# Patient Record
Sex: Male | Born: 1965 | Race: Black or African American | Hispanic: No | Marital: Single | Smoking: Current some day smoker
Health system: Southern US, Community
[De-identification: ages and names within clinical notes are randomized; demographics above are authoritative.]

## PROBLEM LIST (undated history)

## (undated) DIAGNOSIS — I10 Essential (primary) hypertension: Secondary | ICD-10-CM

## (undated) DIAGNOSIS — I509 Heart failure, unspecified: Secondary | ICD-10-CM

## (undated) DIAGNOSIS — N189 Chronic kidney disease, unspecified: Secondary | ICD-10-CM

## (undated) HISTORY — DX: Essential (primary) hypertension: I10

## (undated) HISTORY — DX: Heart failure, unspecified: I50.9

## (undated) HISTORY — PX: CARDIAC CATHETERIZATION: SHX172

## (undated) HISTORY — DX: Chronic kidney disease, unspecified: N18.9

---

## 1999-11-13 ENCOUNTER — Emergency Department (HOSPITAL_COMMUNITY): Admission: EM | Admit: 1999-11-13 | Discharge: 1999-11-13 | Payer: Self-pay | Admitting: Emergency Medicine

## 1999-11-13 ENCOUNTER — Encounter: Payer: Self-pay | Admitting: Emergency Medicine

## 2009-11-06 ENCOUNTER — Emergency Department: Payer: Self-pay | Admitting: Emergency Medicine

## 2011-01-17 ENCOUNTER — Ambulatory Visit: Payer: Self-pay | Admitting: Internal Medicine

## 2011-01-19 ENCOUNTER — Inpatient Hospital Stay: Payer: Self-pay | Admitting: Student

## 2011-01-19 DIAGNOSIS — R0602 Shortness of breath: Secondary | ICD-10-CM

## 2011-01-20 DIAGNOSIS — I10 Essential (primary) hypertension: Secondary | ICD-10-CM

## 2011-02-03 ENCOUNTER — Ambulatory Visit (INDEPENDENT_AMBULATORY_CARE_PROVIDER_SITE_OTHER): Payer: Managed Care, Other (non HMO) | Admitting: Cardiovascular Disease

## 2011-02-03 ENCOUNTER — Encounter: Payer: Self-pay | Admitting: Cardiovascular Disease

## 2011-02-03 DIAGNOSIS — I119 Hypertensive heart disease without heart failure: Secondary | ICD-10-CM

## 2011-02-03 DIAGNOSIS — I428 Other cardiomyopathies: Secondary | ICD-10-CM

## 2011-02-03 DIAGNOSIS — R609 Edema, unspecified: Secondary | ICD-10-CM

## 2011-02-03 DIAGNOSIS — R0602 Shortness of breath: Secondary | ICD-10-CM

## 2011-02-03 DIAGNOSIS — E785 Hyperlipidemia, unspecified: Secondary | ICD-10-CM

## 2011-02-03 DIAGNOSIS — I10 Essential (primary) hypertension: Secondary | ICD-10-CM | POA: Insufficient documentation

## 2011-02-03 MED ORDER — FUROSEMIDE 20 MG PO TABS: 20.0000 mg | ORAL_TABLET | Freq: Every day | ORAL | Status: DC | PRN

## 2011-02-03 MED ORDER — HYDRALAZINE HCL 100 MG PO TABS: 100.0000 mg | ORAL_TABLET | Freq: Four times a day (QID) | ORAL | Status: DC

## 2011-02-03 NOTE — Assessment & Plan Note (Signed)
Etiology of his depressed ejection fraction likely secondary to severe hypertensive cardiomyopathy. We will continue aggressive medical management of his blood pressure. He does have lower extremity edema and will start Lasix 20 mg p.r.n. For symptoms of edema or shortness of breath. We have instructed him to take this with banana or citrus or over-the-counter potassium.

## 2011-02-03 NOTE — Assessment & Plan Note (Signed)
We will start Lasix for his edema. This will also help his blood pressure. We have encouraged him to stay away from salt.

## 2011-02-03 NOTE — Assessment & Plan Note (Signed)
We will increase his hydralazine to 75 mg q.i.d. With possible titration to 100 mg q.i.d. To reach a diastolic pressure less than 90.

## 2011-02-03 NOTE — Assessment & Plan Note (Signed)
He did have one episode of shortness of breath after discharge. Uncertain if this is from hypertension or mild fluid overload. We will start the Lasix and have him closely monitor his blood pressure.

## 2011-02-03 NOTE — Progress Notes (Signed)
Patient ID: Cristian Pollard, male    DOB: 11/01/1955, 45 y.o.   MRN: [id number]  HPI Comments: 45 year old male with history of severe hypertension, presenting to the hospital on September 3 with shortness of breath, found to have renal failure, elevated cardiac enzymes, severe hypertension. Echocardiogram showed ejection fraction 35-45%, previous cardiac catheterization several years with reportedly no disease, hyperlipidemia with total cholesterol 250, LDL 156, BNP 1300, peak troponin of 0.15 felt secondary to hypertension, LVH.  He presents to establish care in the office.  He was discharged on labetalol, hydralazine q.i.d., nifedipine 90 mg daily, aspirin and simvastatin.  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 Since then, he has not had any problems. His blood pressure continues to be borderline elevated though significantly better and prior to his admission. He is scheduled to have followup with Dr. Cherylann Ratel in several days.  EKG shows normal sinus rhythm with rate 88 beats per minute, T-wave abnormality in V3 through V6, 2, 3, aVF   Outpatient Encounter Prescriptions as of 02/03/2011  Medication Sig Dispense Refill  . aspirin 81 MG tablet Take 81 mg by mouth daily.        Marland Kitchen labetalol (NORMODYNE) 300 MG tablet Take 300 mg by mouth 2 (two) times daily.        Marland Kitchen NIFEdipine (PROCARDIA XL/ADALAT-CC) 90 MG 24 hr tablet Take 90 mg by mouth daily.        . simvastatin (ZOCOR) 20 MG tablet Take 20 mg by mouth at bedtime.        .  hydrALAZINE (APRESOLINE) 50 MG tablet Take 50 mg by mouth 4 (four) times daily.           Review of Systems  Constitutional: Negative.   HENT: Negative.   Eyes: Negative.   Respiratory: Positive for shortness of breath.   Cardiovascular: Negative.   Gastrointestinal: Negative.   Musculoskeletal: Negative.   Skin: Negative.   Neurological: Negative.   Hematological:  Negative.   Psychiatric/Behavioral: Negative.   All other systems reviewed and are negative.    BP 165/97  Pulse 90  Ht 5\' 8"  (1.727 m)  Wt 243 lb (110.224 kg)  BMI 36.95 kg/m2   Physical Exam  Nursing note and vitals reviewed. Constitutional: He is oriented to person, place, and time. He appears well-developed and well-nourished.  HENT:  Head: Normocephalic.  Nose: Nose normal.  Mouth/Throat: Oropharynx is clear and moist.  Eyes: Conjunctivae are normal. Pupils are equal, round, and reactive to light.  Neck: Normal range of motion. Neck supple. No JVD present.  Cardiovascular: Normal rate, regular rhythm, S1 normal, S2 normal, normal heart sounds and intact distal pulses.  Exam reveals no gallop and no friction rub.   No murmur heard.      Trace to 1+ edema to the mid shins that is pitting  Pulmonary/Chest: Effort normal and breath sounds normal. No respiratory distress. He has no wheezes. He has no rales. He exhibits no tenderness.  Abdominal: Soft. Bowel sounds are normal. He exhibits no distension. There is no tenderness.  Musculoskeletal: Normal range of motion. He exhibits no edema and no tenderness.  Lymphadenopathy:    He has no cervical adenopathy.  Neurological: He is alert and oriented to person, place, and time. Coordination normal.  Skin: Skin is warm and dry. No rash noted. No erythema.  Psychiatric: He has a normal mood and  affect. His behavior is normal. Judgment and thought content normal.           Assessment and Plan

## 2011-02-03 NOTE — Assessment & Plan Note (Signed)
Cholesterol is very elevated, measured at 250 in the hospital. We have suggested a cholesterol check in 3 months time.

## 2011-02-03 NOTE — Patient Instructions (Signed)
Your physician has recommended you make the following change in your medication: INCREASE Hydralazine to 1 1/2 tablet (75mg ) for 1 week, if BP still elevated, then INCREASE to 100mg  4 times a day.  Start Lasix 20mg  in AM AS NEEDED for leg swelling or shortness of breath.   Your physician recommends that you schedule a follow-up appointment in: 3 months

## 2011-03-13 ENCOUNTER — Telehealth: Payer: Self-pay | Admitting: *Deleted

## 2011-03-16 ENCOUNTER — Ambulatory Visit (INDEPENDENT_AMBULATORY_CARE_PROVIDER_SITE_OTHER): Payer: Managed Care, Other (non HMO) | Admitting: *Deleted

## 2011-03-16 ENCOUNTER — Encounter: Payer: Self-pay | Admitting: *Deleted

## 2011-03-16 ENCOUNTER — Other Ambulatory Visit: Payer: Self-pay | Admitting: Cardiovascular Disease

## 2011-03-16 VITALS — BP 110/80 | HR 89 | Ht 68.0 in | Wt 237.0 lb

## 2011-03-16 DIAGNOSIS — R609 Edema, unspecified: Secondary | ICD-10-CM

## 2011-03-16 NOTE — Progress Notes (Signed)
Pt in today for BMP/BNP due to continued BLE edema. Pt was seen 02/03/11, and was started on Lasix 20mg  PRN. Pt states he has been taking this daily. Pt is on feet all day at work, BLE showing 1+ pitting edema today. Pt denies SOB. His weight is down 6 lbs, today 237 lbs in >1 month. I advised pt to take an extra dose of Lasix 20mg  today only and will discuss with Dr. Mariah Milling for further rec's. Will call pt with lab results tomorrow and will call him back today with Dr. Ethelene Hal recommendation. Pt ok with this.

## 2011-03-17 LAB — BASIC METABOLIC PANEL
Calcium: 9.1 mg/dL (ref 8.7–10.2)
Creatinine, Ser: 2.61 mg/dL — ABNORMAL HIGH (ref 0.76–1.27)
GFR calc Af Amer: 30 mL/min/{1.73_m2} — ABNORMAL LOW (ref >59)
GFR calc non Af Amer: 26 mL/min/{1.73_m2} — ABNORMAL LOW (ref >59)
Glucose: 107 mg/dL — ABNORMAL HIGH (ref 65–99)
Potassium: 4.7 mmol/L (ref 3.5–5.2)
Sodium: 139 mmol/L (ref 134–144)

## 2011-03-17 LAB — BRAIN NATRIURETIC PEPTIDE: BNP: 14.6 pg/mL (ref 0.0–100.0)

## 2011-03-17 NOTE — Progress Notes (Signed)
Addended by: Phoebe Sharps on: 03/17/2011 04:32 PM   Modules accepted: Level of Service

## 2011-03-17 NOTE — Progress Notes (Signed)
Swelling is likely coming from nifedipine (ca channel blocker) Lasix will not help, just hurt kidneys.

## 2011-03-17 NOTE — Patient Instructions (Signed)
Blood work completed. We will call with results

## 2011-03-18 ENCOUNTER — Telehealth: Payer: Self-pay | Admitting: *Deleted

## 2011-03-18 NOTE — Telephone Encounter (Signed)
Discussed lab results with pt (03/17/11) see result note. Pt does have renal MD, Dr. Cherylann Ratel, we will forward these results to him. Also, after discussing with TG, pt will need to decr lasix to 20mg  no more than QOD. His LE swelling more than likely due to either vein insufficiency, pt stands on feet all day; or his BP medication (calcium channel blocker). Will ask pt if he wants to change BP meds, or if this is not a problem for pt, he can continue and wear compression socks as he is already doing. Attempted to contact pt, LMOM TCB. Will discuss with him upon return call.

## 2011-03-18 NOTE — Telephone Encounter (Signed)
Pt called stating he would like to change BP medication and stop Nifedipine to see if swelling improves. Labs were sent to Dr. Cherylann Ratel, and pt knows not to take lasix 20 more than QOD.

## 2011-03-18 NOTE — Progress Notes (Signed)
Noted. Called pt Cristian Pollard. Will discuss whether he wants to change to different BP med, and advise he only take Lasix every other day (but no more than that) as we had discussed earlier.

## 2011-03-19 NOTE — Telephone Encounter (Signed)
Would increase hydralazine to 100 mg qid Start clonidine 0.1 mg BID Hold nifedipine Allow several weeks for edema to resolve Monitor BP IF BP next week running greater than 140, will need to increase clinidine to 0.2 BID

## 2011-03-23 NOTE — Telephone Encounter (Signed)
NA x2

## 2011-03-24 ENCOUNTER — Telehealth: Payer: Self-pay

## 2011-03-24 MED ORDER — CLONIDINE HCL 0.1 MG PO TABS: 0.1000 mg | ORAL_TABLET | Freq: Two times a day (BID) | ORAL | Status: DC

## 2011-03-24 NOTE — Telephone Encounter (Signed)
Sent Rx for clonidine 0.1 mg take one tablet bid to [facility] in [place].

## 2011-03-24 NOTE — Telephone Encounter (Signed)
Changed medication from nefidipine to clonidine 0.1 mg take one tablet bid.

## 2011-03-24 NOTE — Telephone Encounter (Signed)
Notified patient need to increase hydralazine to 100 mg qid with adding clonidine 0.1 mg one tablet bid.  The patient will monitor blood pressure and stop the nifidipine. He will call the office if has any questions regarding the medication changes.

## 2011-03-27 ENCOUNTER — Telehealth: Payer: Self-pay | Admitting: *Deleted

## 2011-03-27 MED ORDER — CLONIDINE HCL 0.2 MG PO TABS: 0.2000 mg | ORAL_TABLET | Freq: Two times a day (BID) | ORAL | Status: DC

## 2011-03-27 NOTE — Telephone Encounter (Signed)
Notified pt below. He will incr clonidine to 0.2 BID and call next Friday with BP numbers.

## 2011-03-27 NOTE — Telephone Encounter (Signed)
Pt says since changing to clonidine 0.1 bid his swelling has improved (was on nifedipine). However, he says it is not helping BP. He is taking Hydralazine 100mg  QID and Clonidine 0.1 and BP is ranging from 148-161/90s. Do you want him to incr to clondine 0.2? Please advise.

## 2011-03-27 NOTE — Telephone Encounter (Signed)
Would increase to clonidine 0.2 mg BID thx

## 2011-03-27 NOTE — Telephone Encounter (Signed)
Attempted to contact pt, LMOM TCB.  

## 2011-04-07 ENCOUNTER — Telehealth: Payer: Self-pay | Admitting: Cardiovascular Disease

## 2011-04-07 NOTE — Telephone Encounter (Signed)
BP numbers after incr clonidine to 0.2 BID (see phone note 11/19). BP's improved, previously were 148-161/90s.

## 2011-04-07 NOTE — Telephone Encounter (Signed)
Pt calling for BP readings  Monday       139/95 140/82  145/96 Tuesday  139/93  142/87    136/84 wednesday      130/87  136/89    146/80 Thursday      149/83  144/90   123/81 Friday           129/87   143/88    129/83

## 2011-04-07 NOTE — Telephone Encounter (Signed)
BP numbers after incr clonidine to 0.2 BID (see phone note 11/19). BP's improved, previously were 148-161/90s. Attempted to contact pt, LMOM TCB.

## 2011-04-08 NOTE — Telephone Encounter (Signed)
Numbers look good

## 2011-04-23 NOTE — Telephone Encounter (Signed)
Opened in error

## 2011-05-06 ENCOUNTER — Ambulatory Visit (INDEPENDENT_AMBULATORY_CARE_PROVIDER_SITE_OTHER): Payer: Managed Care, Other (non HMO) | Admitting: Cardiovascular Disease

## 2011-05-06 ENCOUNTER — Encounter: Payer: Self-pay | Admitting: Cardiovascular Disease

## 2011-05-06 DIAGNOSIS — I428 Other cardiomyopathies: Secondary | ICD-10-CM

## 2011-05-06 DIAGNOSIS — E785 Hyperlipidemia, unspecified: Secondary | ICD-10-CM

## 2011-05-06 DIAGNOSIS — I119 Hypertensive heart disease without heart failure: Secondary | ICD-10-CM

## 2011-05-06 DIAGNOSIS — I10 Essential (primary) hypertension: Secondary | ICD-10-CM

## 2011-05-06 MED ORDER — LOSARTAN POTASSIUM 100 MG PO TABS: 100.0000 mg | ORAL_TABLET | Freq: Every day | ORAL | Status: DC

## 2011-05-06 MED ORDER — LOVASTATIN 20 MG PO TABS: 20.0000 mg | ORAL_TABLET | Freq: Every day | ORAL | Status: DC

## 2011-05-06 NOTE — Assessment & Plan Note (Signed)
Elevated cholesterol in the hospital. We will start him on lovastatin.

## 2011-05-06 NOTE — Progress Notes (Signed)
   Patient ID: Cristian Pollard, male    DOB: [birth year], 45 y.o.   MRN: [id number]  HPI Comments: 45 year old male with history of severe hypertension, presenting to the hospital on September 3 with shortness of breath, found to have renal failure, elevated cardiac enzymes, severe hypertension. Echocardiogram showed ejection fraction 35-45%, previous cardiac catheterization several years with reportedly no disease, hyperlipidemia with total cholesterol 250, LDL 156, BNP 1300, peak troponin of 0.15 felt secondary to hypertension, LVH.  He presents for routine followup  He reports that he has been doing well on his current medication regimen with only borderline elevated blood pressures. He does have problems with erectile dysfunction and wonders if this could be his medications. Otherwise he feels well and has had no side effects. No chest pain or shortness of breath.      Outpatient Encounter Prescriptions as of 05/06/2011  Medication Sig Dispense Refill  . aspirin 81 MG tablet Take 81 mg by mouth daily.        . cloNIDine (CATAPRES) 0.2 MG tablet Take 1 tablet (0.2 mg total) by mouth 2 (two) times daily.  60 tablet  6  . enalapril (VASOTEC) 5 MG tablet Take 5 mg by mouth daily.        . hydrALAZINE (APRESOLINE) 100 MG tablet Take 1 tablet (100 mg total) by mouth 4 (four) times daily.  120 tablet  11  . labetalol (NORMODYNE) 300 MG tablet Take 300 mg by mouth 2 (two) times daily.           Review of Systems  Constitutional: Negative.   HENT: Negative.   Eyes: Negative.   Respiratory: Negative.   Cardiovascular: Negative.   Gastrointestinal: Negative.   Genitourinary:       Erectile dysfunction  Musculoskeletal: Negative.   Skin: Negative.   Neurological: Negative.   Hematological: Negative.   Psychiatric/Behavioral: Negative.   All other systems reviewed and are negative.    BP 124/92  Pulse 72  Ht 5\' 8"  (1.727 m)  Wt 231 lb (104.781 kg)  BMI 35.12 kg/m2  Physical Exam    Nursing note and vitals reviewed. Constitutional: He is oriented to person, place, and time. He appears well-developed and well-nourished.  HENT:  Head: Normocephalic.  Nose: Nose normal.  Mouth/Throat: Oropharynx is clear and moist.  Eyes: Conjunctivae are normal. Pupils are equal, round, and reactive to light.  Neck: Normal range of motion. Neck supple. No JVD present.  Cardiovascular: Normal rate, regular rhythm, S1 normal, S2 normal, normal heart sounds and intact distal pulses.  Exam reveals no gallop and no friction rub.   No murmur heard. Pulmonary/Chest: Effort normal and breath sounds normal. No respiratory distress. He has no wheezes. He has no rales. He exhibits no tenderness.  Abdominal: Soft. Bowel sounds are normal. He exhibits no distension. There is no tenderness.  Musculoskeletal: Normal range of motion. He exhibits no edema and no tenderness.  Lymphadenopathy:    He has no cervical adenopathy.  Neurological: He is alert and oriented to person, place, and time. Coordination normal.  Skin: Skin is warm and dry. No rash noted. No erythema.  Psychiatric: He has a normal mood and affect. His behavior is normal. Judgment and thought content normal.           Assessment and Plan

## 2011-05-06 NOTE — Assessment & Plan Note (Addendum)
Blood pressure appears to be relatively well controlled Though he is having symptoms of erectile dysfunction. In an effort to improve this, and avoid side effects from his medications, we will change his ACE inhibitor to an ARB. We will also try to wean him off the labetalol. We have told him to hold the enalapril and start losartan 100 mg daily, and cut the labetalol in half b.i.d. For now. If he continues to have erectile dysfunction, we could wean the labetalol off and use low-dose nitrates such as isosorbide as needed for blood pressure control. He would be okay to use Cialis or Viagra.

## 2011-05-06 NOTE — Assessment & Plan Note (Signed)
No significant symptoms are concerning for worsening cardiomyopathy. No indication for repeat echocardiogram at this time. We can certainly have this done if he has shortness of breath or edema, or to establish a new baseline at some point in the future.

## 2011-05-06 NOTE — Patient Instructions (Addendum)
You are doing well. Stop enalapril Start losartan one a day Cut the labetolol in 1/2 twice a day  For cholesterol, start lovastatin one a day (night)  Please call us if you have new issues that need to be addressed before your next appt.  Your physician wants you to follow-up in: 6 months.  You will receive a reminder letter in the mail two months in advance. If you don't receive a letter, please call our office to schedule the follow-up appointment.

## 2011-11-02 ENCOUNTER — Ambulatory Visit (INDEPENDENT_AMBULATORY_CARE_PROVIDER_SITE_OTHER): Payer: Managed Care, Other (non HMO) | Admitting: Cardiovascular Disease

## 2011-11-02 ENCOUNTER — Encounter: Payer: Self-pay | Admitting: Cardiovascular Disease

## 2011-11-02 VITALS — BP 100/70 | HR 70 | Ht 68.0 in | Wt 223.5 lb

## 2011-11-02 DIAGNOSIS — I428 Other cardiomyopathies: Secondary | ICD-10-CM

## 2011-11-02 DIAGNOSIS — I119 Hypertensive heart disease without heart failure: Secondary | ICD-10-CM

## 2011-11-02 DIAGNOSIS — E785 Hyperlipidemia, unspecified: Secondary | ICD-10-CM

## 2011-11-02 DIAGNOSIS — I10 Essential (primary) hypertension: Secondary | ICD-10-CM

## 2011-11-02 MED ORDER — TADALAFIL 5 MG PO TABS: 5.0000 mg | ORAL_TABLET | Freq: Every day | ORAL | Status: DC | PRN

## 2011-11-02 NOTE — Progress Notes (Signed)
Patient ID: Cristian Pollard, male    DOB: 08-09-1965, 46 y.o.   MRN: [id number]  HPI Comments: 46 year old male with history of severe hypertension, presenting to the hospital on September 3 with shortness of breath, found to have renal failure, elevated cardiac enzymes, severe hypertension. Echocardiogram showed ejection fraction 35-45%, previous cardiac catheterization several years with reportedly no disease, hyperlipidemia with total cholesterol 250, LDL 156, BNP 1300, peak troponin of 0.15 felt secondary to hypertension, LVH.  He presents for routine followup  He reports that he has been doing well on his current medication regimen with good blood pressures. He continues to have problems with erectile dysfunction, maybe slightly better than before after changing his ACE inhibitor to ARB. We did decrease the labetalol to 150 mg twice a day for erectile dysfunction on his last visit. The blood pressure was high and he increased the labetalol back to 300 mg twice a day. he feels well and has had no side effects. No chest pain or shortness of breath. 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  EKG shows normal sinus rhythm with rate 70 beats per minute, nonspecific ST abnormality most notable in leads 3, aVF, V6       Outpatient Encounter Prescriptions as of 11/02/2011  Medication Sig Dispense Refill  . aspirin 81 MG tablet Take 81 mg by mouth daily.        . cloNIDine (CATAPRES) 0.2 MG tablet Take 1 tablet (0.2 mg total) by mouth 2 (two) times daily.  60 tablet  6  . hydrALAZINE (APRESOLINE) 100 MG tablet Take 1 tablet (100 mg total) by mouth 4 (four) times daily.  120 tablet  11  . labetalol (NORMODYNE) 300 MG tablet Take 300 mg by mouth 2 (two) times daily.       Marland Kitchen losartan (COZAAR) 100 MG tablet Take 1 tablet (100 mg total) by mouth daily.  90 tablet  6  . lovastatin (MEVACOR) 20 MG tablet Take 1 tablet (20 mg total) by mouth at bedtime.  90 tablet  3    Review of Systems  Constitutional: Negative.   HENT: Negative.   Eyes: Negative.   Respiratory: Negative.   Cardiovascular: Negative.   Gastrointestinal: Negative.   Genitourinary:       Erectile dysfunction  Musculoskeletal: Positive for arthralgias.  Skin: Negative.   Neurological: Negative.   Hematological: Negative.   Psychiatric/Behavioral: Negative.   All other systems reviewed and are negative.    BP 100/70  Pulse 70  Ht 5\' 8"  (1.727 m)  Wt 223 lb 8 oz (101.379 kg)  BMI 33.98 kg/m2  Physical Exam  Nursing note and vitals reviewed. Constitutional: He is oriented to person, place, and time. He appears well-developed and well-nourished.  HENT:  Head: Normocephalic.  Nose: Nose normal.  Mouth/Throat: Oropharynx is clear and moist.  Eyes: Conjunctivae are normal. Pupils are equal, round, and reactive to light.  Neck: Normal range of motion. Neck supple. No JVD present.  Cardiovascular: Normal rate, regular rhythm, S1 normal, S2 normal, normal heart sounds and intact distal pulses.  Exam reveals no gallop and no friction rub.   No murmur heard. Pulmonary/Chest: Effort normal and breath sounds normal. No respiratory distress. He has no wheezes. He has no rales. He exhibits no tenderness.  Abdominal: Soft. Bowel sounds are normal. He exhibits no distension. There is no tenderness.  Musculoskeletal: Normal range of motion. He exhibits no edema and no tenderness.  Lymphadenopathy:    He has no cervical adenopathy.  Neurological: He is alert and oriented to person, place, and time. Coordination normal.  Skin: Skin is warm and dry. No rash noted. No erythema.  Psychiatric: He has a normal mood and affect. His behavior is normal. Judgment and thought content normal.           Assessment and Plan

## 2011-11-02 NOTE — Patient Instructions (Addendum)
You are doing well. No medication changes were made.  We will schedule an echocardiogram for cardiomyopathy Lab work today  Please call us if you have new issues that need to be addressed before your next appt.  Your physician wants you to follow-up in: 6 months.  You will receive a reminder letter in the mail two months in advance. If you don't receive a letter, please call our office to schedule the follow-up appointment.

## 2011-11-02 NOTE — Assessment & Plan Note (Signed)
We will check cholesterol today and adjust medications accordingly.

## 2011-11-02 NOTE — Assessment & Plan Note (Signed)
Blood pressure significantly improved on his current medication regimen. He feels well. No medication changes made. Repeat echocardiogram for new baseline ejection fraction.

## 2011-11-02 NOTE — Assessment & Plan Note (Signed)
Blood pressure is perfect. Continue current medications.

## 2011-11-03 LAB — HEPATIC FUNCTION PANEL
Albumin: 4.5 g/dL (ref 3.5–5.5)
Bilirubin, Direct: 0.11 mg/dL (ref 0.00–0.40)
Total Bilirubin: 0.3 mg/dL (ref 0.0–1.2)
Total Protein: 6.8 g/dL (ref 6.0–8.5)

## 2011-11-03 LAB — LIPID PANEL: Chol/HDL Ratio: 3.2 ratio units (ref 0.0–5.0)

## 2011-11-09 ENCOUNTER — Other Ambulatory Visit: Payer: Self-pay | Admitting: *Deleted

## 2011-11-09 MED ORDER — CLONIDINE HCL 0.2 MG PO TABS: 0.2000 mg | ORAL_TABLET | Freq: Two times a day (BID) | ORAL | Status: DC

## 2011-11-09 MED ORDER — HYDRALAZINE HCL 100 MG PO TABS: 100.0000 mg | ORAL_TABLET | Freq: Four times a day (QID) | ORAL | Status: DC

## 2011-11-09 NOTE — Telephone Encounter (Signed)
Refilled Hydralazine

## 2011-11-09 NOTE — Telephone Encounter (Signed)
Refilled Clonidine

## 2011-11-23 ENCOUNTER — Other Ambulatory Visit: Payer: Self-pay

## 2011-11-23 ENCOUNTER — Other Ambulatory Visit (INDEPENDENT_AMBULATORY_CARE_PROVIDER_SITE_OTHER): Payer: Managed Care, Other (non HMO)

## 2011-11-23 DIAGNOSIS — I10 Essential (primary) hypertension: Secondary | ICD-10-CM

## 2011-11-23 DIAGNOSIS — I119 Hypertensive heart disease without heart failure: Secondary | ICD-10-CM

## 2011-11-23 DIAGNOSIS — I428 Other cardiomyopathies: Secondary | ICD-10-CM

## 2011-12-07 NOTE — Progress Notes (Signed)
NA x 1

## 2012-05-02 ENCOUNTER — Encounter: Payer: Self-pay | Admitting: Cardiovascular Disease

## 2012-05-02 ENCOUNTER — Ambulatory Visit (INDEPENDENT_AMBULATORY_CARE_PROVIDER_SITE_OTHER): Payer: Managed Care, Other (non HMO) | Admitting: Cardiovascular Disease

## 2012-05-02 VITALS — BP 140/86 | HR 82 | Ht 68.0 in | Wt 213.5 lb

## 2012-05-02 DIAGNOSIS — I119 Hypertensive heart disease without heart failure: Secondary | ICD-10-CM

## 2012-05-02 DIAGNOSIS — I428 Other cardiomyopathies: Secondary | ICD-10-CM

## 2012-05-02 DIAGNOSIS — I10 Essential (primary) hypertension: Secondary | ICD-10-CM

## 2012-05-02 DIAGNOSIS — R0789 Other chest pain: Secondary | ICD-10-CM

## 2012-05-02 MED ORDER — LOSARTAN POTASSIUM 100 MG PO TABS: 100.0000 mg | ORAL_TABLET | Freq: Every day | ORAL | Status: DC

## 2012-05-02 MED ORDER — LOVASTATIN 20 MG PO TABS: 20.0000 mg | ORAL_TABLET | Freq: Every day | ORAL | Status: DC

## 2012-05-02 MED ORDER — SILDENAFIL CITRATE 100 MG PO TABS: 100.0000 mg | ORAL_TABLET | Freq: Every day | ORAL | Status: DC | PRN

## 2012-05-02 MED ORDER — HYDRALAZINE HCL 100 MG PO TABS: 100.0000 mg | ORAL_TABLET | Freq: Four times a day (QID) | ORAL | Status: DC

## 2012-05-02 MED ORDER — LABETALOL HCL 300 MG PO TABS: 300.0000 mg | ORAL_TABLET | Freq: Two times a day (BID) | ORAL | Status: DC

## 2012-05-02 NOTE — Progress Notes (Signed)
Patient ID: Cristian Pollard, male    DOB: [birth year], 46 y.o.   MRN: [id number]  HPI Comments: 46 year old male with history of severe hypertension, presenting to the hospital on September 3 with shortness of breath, found to have renal failure, elevated cardiac enzymes, severe hypertension. Echocardiogram showed ejection fraction 35-45%, previous cardiac catheterization several years with reportedly no disease, hyperlipidemia with total cholesterol 250, LDL 156, BNP 1300, peak troponin of 0.15 felt secondary to hypertension, LVH.  He presents for routine followup  Overall he reports that he has been doing well. He does have a upper respiratory infection over the past year. He has been out of labetalol for the past several days. Weight is down 10 pounds from his prior clinic visit. He reports blood pressure has been well-controlled at home on his regular regimen. Otherwise no complaints. He is working long days standing on cement. Jaw has been stressful  EKG shows normal sinus rhythm with rate 82 beats per minute, nonspecific ST abnormality most notable in leads 3, aVF, V5 to V6       Outpatient Encounter Prescriptions as of 11/02/2011  Medication Sig Dispense Refill  . aspirin 81 MG tablet Take 81 mg by mouth daily.        . cloNIDine (CATAPRES) 0.2 MG tablet Take 1 tablet (0.2 mg total) by mouth 2 (two) times daily.  60 tablet  6  . hydrALAZINE (APRESOLINE) 100 MG tablet Take 1 tablet (100 mg total) by mouth 4 (four) times daily.  120 tablet  11  . labetalol (NORMODYNE) 300 MG tablet Take 300 mg by mouth 2 (two) times daily.       Marland Kitchen losartan (COZAAR) 100 MG tablet Take 1 tablet (100 mg total) by mouth daily.  90 tablet  6  . lovastatin (MEVACOR) 20 MG tablet Take 1 tablet (20 mg total) by mouth at bedtime.  90 tablet  3   Review of Systems  Constitutional: Negative.   HENT: Negative.   Eyes: Negative.   Respiratory: Negative.   Cardiovascular: Negative.   Gastrointestinal: Negative.    Genitourinary:       Erectile dysfunction  Musculoskeletal: Positive for arthralgias.  Skin: Negative.   Neurological: Negative.   Hematological: Negative.   Psychiatric/Behavioral: Negative.   All other systems reviewed and are negative.    BP 140/86  Pulse 82  Ht 5\' 8"  (1.727 m)  Wt 213 lb 8 oz (96.843 kg)  BMI 32.46 kg/m2  Physical Exam  Nursing note and vitals reviewed. Constitutional: He is oriented to person, place, and time. He appears well-developed and well-nourished.  HENT:  Head: Normocephalic.  Nose: Nose normal.  Mouth/Throat: Oropharynx is clear and moist.  Eyes: Conjunctivae normal are normal. Pupils are equal, round, and reactive to light.  Neck: Normal range of motion. Neck supple. No JVD present.  Cardiovascular: Normal rate, regular rhythm, S1 normal, S2 normal, normal heart sounds and intact distal pulses.  Exam reveals no gallop and no friction rub.   No murmur heard. Pulmonary/Chest: Effort normal and breath sounds normal. No respiratory distress. He has no wheezes. He has no rales. He exhibits no tenderness.  Abdominal: Soft. Bowel sounds are normal. He exhibits no distension. There is no tenderness.  Musculoskeletal: Normal range of motion. He exhibits no edema and no tenderness.  Lymphadenopathy:    He has no cervical adenopathy.  Neurological: He is alert and oriented to person, place, and time. Coordination normal.  Skin: Skin is warm and dry. No  rash noted. No erythema.  Psychiatric: He has a normal mood and affect. His behavior is normal. Judgment and thought content normal.           Assessment and Plan

## 2012-05-02 NOTE — Assessment & Plan Note (Signed)
We will restart labetalol and continue his other medications. Blood pressure much improved

## 2012-05-02 NOTE — Patient Instructions (Addendum)
You are doing well. No medication changes were made.  Please call us if you have new issues that need to be addressed before your next appt.  Your physician wants you to follow-up in: 6 months.  You will receive a reminder letter in the mail two months in advance. If you don't receive a letter, please call our office to schedule the follow-up appointment.   

## 2012-05-02 NOTE — Assessment & Plan Note (Signed)
No clinical signs of heart failure. No significant edema. Recent weight loss of 10 pounds.

## 2012-08-05 ENCOUNTER — Other Ambulatory Visit: Payer: Self-pay | Admitting: Cardiovascular Disease

## 2012-08-05 NOTE — Telephone Encounter (Signed)
Refilled Clonidine sent to [facility] pharmacy.

## 2012-09-19 IMAGING — NM NM LUNG SCAN
2 series · 16 of 16 positions shown · non-contrast
Comparison: none

REASON FOR EXAM: elevated d-dimers,
COMMENTS:

PROCEDURE:     NM  - NM VQ LUNG SCAN  - [DATE]  [DATE] [DATE] [DATE]
RESULT:     Comparison: Chest radiograph performed same day.
Radiopharmaceutical: 42.828 mCi 1c-00m DTPA inhaled gas via a nebulizer for
ventilation; 4.104 mCi 1c-00m MAA administered intravenously for perfusion.
TECHNIQUE: Standard anterior, oblique, and posterior images were obtained
for the ventilation study; anterior and posterior images in varying degrees
of obliquity were obtained for the perfusion study; the ventilation and
perfusion studies were compared with a recent chest x-ray.

[Series 1000: lung perfusion · 1.65mm/px · 4 acquisitions, 8 frames shown]
[im 1/4]
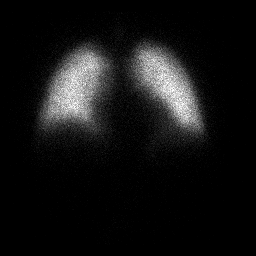
[im 1/4]
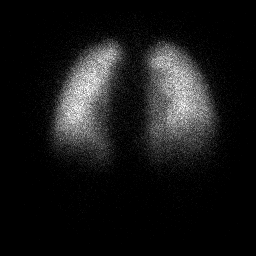
[im 2/4]
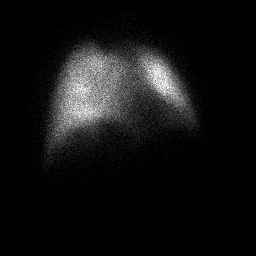
[im 2/4]
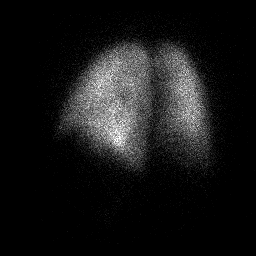
[im 3/4  full-range]
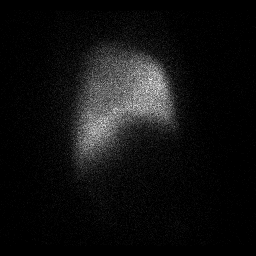
[im 3/4  full-range]
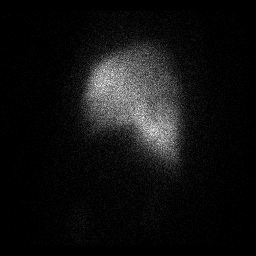
[im 4/4]
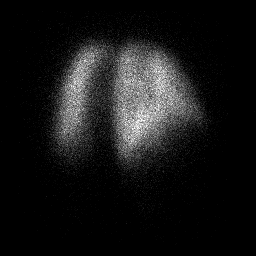
[im 4/4]
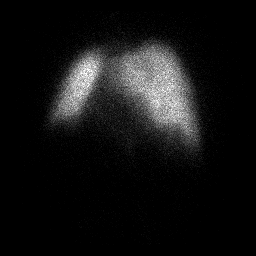

[Series 1000: lung ventilation · 4.80mm/px · 4 acquisitions, 8 frames shown]
[im 1/4]
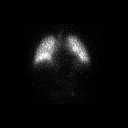
[im 1/4]
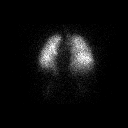
[im 2/4]
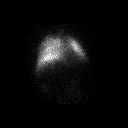
[im 2/4]
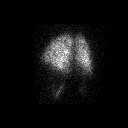
[im 3/4]
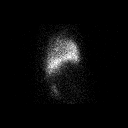
[im 3/4]
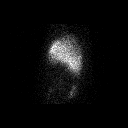
[im 4/4]
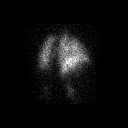
[im 4/4]
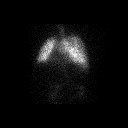

[16 of 16 positions shown; findings below may reference images not displayed]

FINDINGS: There is slight heterogeneous ventilation throughout both lungs. There is
homogeneous perfusion throughout both lungs. Slightly decreased perfusion in
the mid and upper lungs bilaterally on the lateral views is matched with the
ventilation images. No segmental perfusion mismatched perfusion defects
identified.
IMPRESSION: Low probability for pulmonary embolus.

## 2012-10-27 ENCOUNTER — Encounter: Payer: Self-pay | Admitting: Cardiovascular Disease

## 2012-10-27 ENCOUNTER — Ambulatory Visit (INDEPENDENT_AMBULATORY_CARE_PROVIDER_SITE_OTHER): Payer: Managed Care, Other (non HMO) | Admitting: Cardiovascular Disease

## 2012-10-27 VITALS — BP 142/96 | HR 75 | Ht 68.0 in | Wt 221.5 lb

## 2012-10-27 DIAGNOSIS — I43 Cardiomyopathy in diseases classified elsewhere: Secondary | ICD-10-CM

## 2012-10-27 DIAGNOSIS — I10 Essential (primary) hypertension: Secondary | ICD-10-CM

## 2012-10-27 DIAGNOSIS — I119 Hypertensive heart disease without heart failure: Secondary | ICD-10-CM

## 2012-10-27 DIAGNOSIS — E785 Hyperlipidemia, unspecified: Secondary | ICD-10-CM

## 2012-10-27 MED ORDER — SILDENAFIL CITRATE 100 MG PO TABS: 100.0000 mg | ORAL_TABLET | Freq: Every day | ORAL | Status: AC | PRN

## 2012-10-27 NOTE — Assessment & Plan Note (Signed)
I suggested he hold his lovastatin for several weeks to see if his cramping improves. If cramping gets better, we will try an alternate statin at low dose with slow titration upward

## 2012-10-27 NOTE — Assessment & Plan Note (Signed)
Blood pressure reasonably well controlled. We have suggested he take one half labetalol pill extra as needed for high blood pressure, systolic pressure greater than 145.

## 2012-10-27 NOTE — Progress Notes (Signed)
Patient ID: Cristian Pollard, male    DOB: 07/22/1965, 47 y.o.   MRN: [id number]  HPI Comments: 47 year old male with history of severe hypertension, presenting to the hospital on January 19 2011 with shortness of breath, found to have renal failure, elevated cardiac enzymes, severe hypertension. Echocardiogram showed ejection fraction 35-45%, previous cardiac catheterization several years with reportedly no disease, hyperlipidemia with total cholesterol 250, LDL 156, BNP 1300, peak troponin of 0.15 felt secondary to hypertension, LVH.  He presents for routine followup  Overall he states that he is doing well. He works third shift, 9 hours standing on cement. Worsening left knee pain with swelling.  Blood pressure medications have been working well. Systolic pressure typically in the mid to high 1:30 range, low 140s. Diastolic in the 80s. Weight has been stable. No significant side effects from the medications apart from cramping in his legs, worse in the morning when he wakes up and stretches.  EKG shows normal sinus rhythm with rate 75 beats per minute, nonspecific ST abnormality most notable in leads 3, aVF, V5 to V6       Outpatient Encounter Prescriptions as of 10/27/2012  Medication Sig Dispense Refill  . aspirin 81 MG tablet Take 81 mg by mouth daily.        . cloNIDine (CATAPRES) 0.2 MG tablet TAKE ONE TABLET BY MOUTH TWICE DAILY  60 tablet  3  . hydrALAZINE (APRESOLINE) 100 MG tablet Take 1 tablet (100 mg total) by mouth 4 (four) times daily.  120 tablet  11  . labetalol (NORMODYNE) 300 MG tablet Take 1 tablet (300 mg total) by mouth 2 (two) times daily.  180 tablet  4  . losartan (COZAAR) 100 MG tablet Take 1 tablet (100 mg total) by mouth daily.  90 tablet  11  . lovastatin (MEVACOR) 20 MG tablet Take 1 tablet (20 mg total) by mouth at bedtime.  90 tablet  4  . sildenafil (VIAGRA) 100 MG tablet Take 1 tablet (100 mg total) by mouth daily as needed for erectile dysfunction.  3  tablet  6   Review of Systems  Constitutional: Negative.   HENT: Negative.   Eyes: Negative.   Respiratory: Negative.   Cardiovascular: Negative.   Gastrointestinal: Negative.   Genitourinary:       Erectile dysfunction  Musculoskeletal: Positive for myalgias, joint swelling and arthralgias.  Skin: Negative.   Neurological: Negative.   Psychiatric/Behavioral: Negative.   All other systems reviewed and are negative.    BP 142/96  Pulse 75  Ht 5\' 8"  (1.727 m)  Wt 221 lb 8 oz (100.472 kg)  BMI 33.69 kg/m2  Physical Exam  Nursing note and vitals reviewed. Constitutional: He is oriented to person, place, and time. He appears well-developed and well-nourished.  HENT:  Head: Normocephalic.  Nose: Nose normal.  Mouth/Throat: Oropharynx is clear and moist.  Eyes: Conjunctivae are normal. Pupils are equal, round, and reactive to light.  Neck: Normal range of motion. Neck supple. No JVD present.  Cardiovascular: Normal rate, regular rhythm, S1 normal, S2 normal, normal heart sounds and intact distal pulses.  Exam reveals no gallop and no friction rub.   No murmur heard. Pulmonary/Chest: Effort normal and breath sounds normal. No respiratory distress. He has no wheezes. He has no rales. He exhibits no tenderness.  Abdominal: Soft. Bowel sounds are normal. He exhibits no distension. There is no tenderness.  Musculoskeletal: Normal range of motion. He exhibits no edema and no tenderness.  Lymphadenopathy:  He has no cervical adenopathy.  Neurological: He is alert and oriented to person, place, and time. Coordination normal.  Skin: Skin is warm and dry. No rash noted. No erythema.  Psychiatric: He has a normal mood and affect. His behavior is normal. Judgment and thought content normal.      Assessment and Plan

## 2012-10-27 NOTE — Patient Instructions (Signed)
You are doing well.  Please hold the lovastatin for a few weeks to see if cramping gets better Call the office if cramping gets better Go back on the lovastatin if no change to cramping  Please call us if you have new issues that need to be addressed before your next appt.  Your physician wants you to follow-up in: 12 months.  You will receive a reminder letter in the mail two months in advance. If you don't receive a letter, please call our office to schedule the follow-up appointment.

## 2012-12-07 ENCOUNTER — Other Ambulatory Visit: Payer: Self-pay | Admitting: Cardiovascular Disease

## 2013-01-11 ENCOUNTER — Other Ambulatory Visit: Payer: Self-pay | Admitting: Cardiovascular Disease

## 2013-05-20 ENCOUNTER — Other Ambulatory Visit: Payer: Self-pay | Admitting: Cardiovascular Disease

## 2013-05-22 ENCOUNTER — Other Ambulatory Visit: Payer: Self-pay | Admitting: *Deleted

## 2013-05-22 MED ORDER — HYDRALAZINE HCL 100 MG PO TABS: 100.0000 mg | ORAL_TABLET | Freq: Four times a day (QID) | ORAL | Status: DC

## 2013-05-22 NOTE — Telephone Encounter (Signed)
Requested Prescriptions   Signed Prescriptions Disp Refills  . hydrALAZINE (APRESOLINE) 100 MG tablet 120 tablet 3    Sig: Take 1 tablet (100 mg total) by mouth 4 (four) times daily.    Authorizing Provider: Antonieta Iba    Ordering User: Kendrick Fries

## 2013-05-25 ENCOUNTER — Other Ambulatory Visit: Payer: Self-pay | Admitting: *Deleted

## 2013-05-25 MED ORDER — HYDRALAZINE HCL 100 MG PO TABS: ORAL_TABLET | ORAL | Status: DC

## 2013-05-25 NOTE — Telephone Encounter (Signed)
Requested Prescriptions   Signed Prescriptions Disp Refills  . hydrALAZINE (APRESOLINE) 100 MG tablet 120 tablet 3    Sig: TAKE ONE TABLET BY MOUTH 4 TIMES DAILY    Authorizing Provider: Antonieta Iba    Ordering User: Kendrick Fries

## 2013-06-30 ENCOUNTER — Other Ambulatory Visit: Payer: Self-pay | Admitting: Cardiovascular Disease

## 2013-08-03 ENCOUNTER — Other Ambulatory Visit: Payer: Self-pay | Admitting: Cardiovascular Disease

## 2013-08-03 ENCOUNTER — Other Ambulatory Visit: Payer: Self-pay | Admitting: *Deleted

## 2013-08-03 MED ORDER — LABETALOL HCL 300 MG PO TABS: 300.0000 mg | ORAL_TABLET | Freq: Two times a day (BID) | ORAL | Status: DC

## 2013-08-03 NOTE — Telephone Encounter (Signed)
Requested Prescriptions   Signed Prescriptions Disp Refills  . labetalol (NORMODYNE) 300 MG tablet 180 tablet 3    Sig: Take 1 tablet (300 mg total) by mouth 2 (two) times daily.    Authorizing Provider: Antonieta Iba    Ordering User: Kendrick Fries

## 2013-09-13 ENCOUNTER — Other Ambulatory Visit: Payer: Self-pay | Admitting: Cardiovascular Disease

## 2013-09-15 ENCOUNTER — Other Ambulatory Visit: Payer: Self-pay | Admitting: *Deleted

## 2013-09-15 MED ORDER — HYDRALAZINE HCL 100 MG PO TABS: ORAL_TABLET | ORAL | Status: DC

## 2013-10-31 ENCOUNTER — Ambulatory Visit (INDEPENDENT_AMBULATORY_CARE_PROVIDER_SITE_OTHER): Payer: Managed Care, Other (non HMO) | Admitting: Cardiovascular Disease

## 2013-10-31 ENCOUNTER — Encounter: Payer: Self-pay | Admitting: Cardiovascular Disease

## 2013-10-31 VITALS — BP 150/100 | HR 74 | Ht 68.0 in | Wt 225.0 lb

## 2013-10-31 DIAGNOSIS — E785 Hyperlipidemia, unspecified: Secondary | ICD-10-CM

## 2013-10-31 DIAGNOSIS — R609 Edema, unspecified: Secondary | ICD-10-CM

## 2013-10-31 DIAGNOSIS — I428 Other cardiomyopathies: Secondary | ICD-10-CM

## 2013-10-31 DIAGNOSIS — I119 Hypertensive heart disease without heart failure: Secondary | ICD-10-CM

## 2013-10-31 DIAGNOSIS — N183 Chronic kidney disease, stage 3 unspecified: Secondary | ICD-10-CM

## 2013-10-31 DIAGNOSIS — I10 Essential (primary) hypertension: Secondary | ICD-10-CM

## 2013-10-31 DIAGNOSIS — I43 Cardiomyopathy in diseases classified elsewhere: Secondary | ICD-10-CM

## 2013-10-31 MED ORDER — ISOSORBIDE MONONITRATE ER 30 MG PO TB24: 30.0000 mg | ORAL_TABLET | Freq: Every day | ORAL | Status: AC

## 2013-10-31 NOTE — Assessment & Plan Note (Signed)
For pressure control, we will add isosorbide mononitrate 30 mg daily. Encouraged him to stay on his other medications. Suggested he closely monitor his blood pressure

## 2013-10-31 NOTE — Patient Instructions (Signed)
You are doing well. Please start isosorbide one a day for blood pressure  Hold the lovastatin for a few weeks to see if cramping gets better  Please call us if you have new issues that need to be addressed before your next appt.  Your physician wants you to follow-up in: 6 months.  You will receive a reminder letter in the mail two months in advance. If you don't receive a letter, please call our office to schedule the follow-up appointment.

## 2013-10-31 NOTE — Assessment & Plan Note (Signed)
Followed by renal. We'll continue to work on blood pressure control

## 2013-10-31 NOTE — Assessment & Plan Note (Signed)
He continues to have significant cramping. Recommended he hold his lovastatin for several weeks to see if this helps his cramping symptoms

## 2013-10-31 NOTE — Progress Notes (Signed)
Patient ID: Cristian Pollard, male    DOB: [birth year], 48 y.o.   MRN: [id number]  HPI Comments: 48 year old male with history of severe hypertension, presenting to the hospital on January 19 2011 with shortness of breath, found to have renal failure, elevated cardiac enzymes, severe hypertension. Echocardiogram showed ejection fraction 35-45%, previous cardiac catheterization several years with reportedly no disease, hyperlipidemia with total cholesterol 250, LDL 156, BNP 1300, peak troponin of 0.15 felt secondary to hypertension, LVH.  He presents for routine followup  Overall he states that he is doing well. He works third shift, 9 hours standing on cement.  He reports that his blood pressure has been running high, typically systolic pressure 140, sometimes 150. He did not tolerate HCTZ, had weakness like he was being drained.  Weight has been stable. Continues to have leg cramping, worse after he exercises.   EKG shows normal sinus rhythm with rate 74 beats per minute, nonspecific ST abnormality most notable in leads 3, aVF, V5 to V6       Outpatient Encounter Prescriptions as of 10/31/2013  Medication Sig  . aspirin 81 MG tablet Take 81 mg by mouth daily.    . cloNIDine (CATAPRES) 0.2 MG tablet TAKE ONE TABLET BY MOUTH TWICE DAILY  . hydrALAZINE (APRESOLINE) 100 MG tablet TAKE ONE TABLET BY MOUTH 4 TIMES DAILY  . labetalol (NORMODYNE) 300 MG tablet TAKE ONE TABLET BY MOUTH TWICE DAILY  . losartan (COZAAR) 100 MG tablet Take 1 tablet (100 mg total) by mouth daily.  Marland Kitchen lovastatin (MEVACOR) 20 MG tablet TAKE ONE TABLET BY MOUTH AT BEDTIME  . sildenafil (VIAGRA) 100 MG tablet Take 1 tablet (100 mg total) by mouth daily as needed for erectile dysfunction.   Review of Systems  Constitutional: Negative.   HENT: Negative.   Eyes: Negative.   Respiratory: Negative.   Cardiovascular: Negative.   Gastrointestinal: Negative.   Endocrine: Negative.   Genitourinary:       Erectile  dysfunction  Musculoskeletal: Positive for arthralgias, joint swelling and myalgias.  Skin: Negative.   Allergic/Immunologic: Negative.   Neurological: Negative.   Hematological: Negative.   Psychiatric/Behavioral: Negative.   All other systems reviewed and are negative.   BP 150/100  Pulse 74  Ht 5\' 8"  (1.727 m)  Wt 225 lb (102.059 kg)  BMI 34.22 kg/m2  Physical Exam  Nursing note and vitals reviewed. Constitutional: He is oriented to person, place, and time. He appears well-developed and well-nourished.  HENT:  Head: Normocephalic.  Nose: Nose normal.  Mouth/Throat: Oropharynx is clear and moist.  Eyes: Conjunctivae are normal. Pupils are equal, round, and reactive to light.  Neck: Normal range of motion. Neck supple. No JVD present.  Cardiovascular: Normal rate, regular rhythm, S1 normal, S2 normal, normal heart sounds and intact distal pulses.  Exam reveals no gallop and no friction rub.   No murmur heard. Pulmonary/Chest: Effort normal and breath sounds normal. No respiratory distress. He has no wheezes. He has no rales. He exhibits no tenderness.  Abdominal: Soft. Bowel sounds are normal. He exhibits no distension. There is no tenderness.  Musculoskeletal: Normal range of motion. He exhibits no edema and no tenderness.  Lymphadenopathy:    He has no cervical adenopathy.  Neurological: He is alert and oriented to person, place, and time. Coordination normal.  Skin: Skin is warm and dry. No rash noted. No erythema.  Psychiatric: He has a normal mood and affect. His behavior is normal. Judgment and thought content normal.  Assessment and Plan

## 2013-10-31 NOTE — Assessment & Plan Note (Signed)
Recommended continued aggressive care his blood pressure. Systolic pressure continues to run high

## 2013-12-29 ENCOUNTER — Other Ambulatory Visit: Payer: Self-pay | Admitting: *Deleted

## 2013-12-29 MED ORDER — HYDRALAZINE HCL 100 MG PO TABS: ORAL_TABLET | ORAL | Status: AC

## 2014-03-19 ENCOUNTER — Inpatient Hospital Stay (HOSPITAL_COMMUNITY)
Admission: EM | Admit: 2014-03-19 | Discharge: 2014-04-17 | DRG: 023 | Disposition: E | Payer: Managed Care, Other (non HMO) | Source: Other Acute Inpatient Hospital | Attending: Neurosurgery | Admitting: Neurosurgery

## 2014-03-19 ENCOUNTER — Encounter (HOSPITAL_COMMUNITY): Payer: Self-pay | Admitting: General Practice

## 2014-03-19 ENCOUNTER — Inpatient Hospital Stay (HOSPITAL_COMMUNITY): Payer: Managed Care, Other (non HMO)

## 2014-03-19 ENCOUNTER — Emergency Department: Payer: Self-pay | Admitting: Emergency Medicine

## 2014-03-19 DIAGNOSIS — F17290 Nicotine dependence, other tobacco product, uncomplicated: Secondary | ICD-10-CM | POA: Diagnosis present

## 2014-03-19 DIAGNOSIS — T22032A Burn of unspecified degree of left upper arm, initial encounter: Secondary | ICD-10-CM | POA: Diagnosis present

## 2014-03-19 DIAGNOSIS — Z7982 Long term (current) use of aspirin: Secondary | ICD-10-CM

## 2014-03-19 DIAGNOSIS — G935 Compression of brain: Secondary | ICD-10-CM | POA: Diagnosis present

## 2014-03-19 DIAGNOSIS — M6282 Rhabdomyolysis: Secondary | ICD-10-CM | POA: Diagnosis present

## 2014-03-19 DIAGNOSIS — F1099 Alcohol use, unspecified with unspecified alcohol-induced disorder: Secondary | ICD-10-CM | POA: Diagnosis present

## 2014-03-19 DIAGNOSIS — I619 Nontraumatic intracerebral hemorrhage, unspecified: Secondary | ICD-10-CM | POA: Diagnosis present

## 2014-03-19 DIAGNOSIS — G936 Cerebral edema: Secondary | ICD-10-CM | POA: Diagnosis present

## 2014-03-19 DIAGNOSIS — Z6834 Body mass index (BMI) 34.0-34.9, adult: Secondary | ICD-10-CM | POA: Diagnosis not present

## 2014-03-19 DIAGNOSIS — I129 Hypertensive chronic kidney disease with stage 1 through stage 4 chronic kidney disease, or unspecified chronic kidney disease: Secondary | ICD-10-CM | POA: Diagnosis present

## 2014-03-19 DIAGNOSIS — R4020 Unspecified coma: Secondary | ICD-10-CM | POA: Diagnosis present

## 2014-03-19 DIAGNOSIS — Z800 Family history of malignant neoplasm of digestive organs: Secondary | ICD-10-CM

## 2014-03-19 DIAGNOSIS — J9600 Acute respiratory failure, unspecified whether with hypoxia or hypercapnia: Secondary | ICD-10-CM | POA: Diagnosis present

## 2014-03-19 DIAGNOSIS — Z8249 Family history of ischemic heart disease and other diseases of the circulatory system: Secondary | ICD-10-CM | POA: Diagnosis not present

## 2014-03-19 DIAGNOSIS — I610 Nontraumatic intracerebral hemorrhage in hemisphere, subcortical: Principal | ICD-10-CM | POA: Diagnosis present

## 2014-03-19 DIAGNOSIS — X088XXA Exposure to other specified smoke, fire and flames, initial encounter: Secondary | ICD-10-CM | POA: Diagnosis present

## 2014-03-19 DIAGNOSIS — I5022 Chronic systolic (congestive) heart failure: Secondary | ICD-10-CM | POA: Diagnosis present

## 2014-03-19 DIAGNOSIS — Z66 Do not resuscitate: Secondary | ICD-10-CM

## 2014-03-19 DIAGNOSIS — Z01818 Encounter for other preprocedural examination: Secondary | ICD-10-CM

## 2014-03-19 DIAGNOSIS — E785 Hyperlipidemia, unspecified: Secondary | ICD-10-CM | POA: Diagnosis present

## 2014-03-19 DIAGNOSIS — I43 Cardiomyopathy in diseases classified elsewhere: Secondary | ICD-10-CM | POA: Diagnosis present

## 2014-03-19 DIAGNOSIS — F1290 Cannabis use, unspecified, uncomplicated: Secondary | ICD-10-CM | POA: Diagnosis present

## 2014-03-19 DIAGNOSIS — J9601 Acute respiratory failure with hypoxia: Secondary | ICD-10-CM | POA: Diagnosis not present

## 2014-03-19 DIAGNOSIS — G911 Obstructive hydrocephalus: Secondary | ICD-10-CM | POA: Diagnosis present

## 2014-03-19 DIAGNOSIS — E669 Obesity, unspecified: Secondary | ICD-10-CM | POA: Diagnosis present

## 2014-03-19 DIAGNOSIS — I615 Nontraumatic intracerebral hemorrhage, intraventricular: Secondary | ICD-10-CM | POA: Diagnosis present

## 2014-03-19 DIAGNOSIS — I10 Essential (primary) hypertension: Secondary | ICD-10-CM

## 2014-03-19 DIAGNOSIS — D72829 Elevated white blood cell count, unspecified: Secondary | ICD-10-CM | POA: Diagnosis present

## 2014-03-19 DIAGNOSIS — I629 Nontraumatic intracranial hemorrhage, unspecified: Secondary | ICD-10-CM

## 2014-03-19 DIAGNOSIS — N183 Chronic kidney disease, stage 3 (moderate): Secondary | ICD-10-CM | POA: Diagnosis present

## 2014-03-19 DIAGNOSIS — Z515 Encounter for palliative care: Secondary | ICD-10-CM

## 2014-03-19 DIAGNOSIS — Z4659 Encounter for fitting and adjustment of other gastrointestinal appliance and device: Secondary | ICD-10-CM

## 2014-03-19 DIAGNOSIS — I161 Hypertensive emergency: Secondary | ICD-10-CM | POA: Diagnosis present

## 2014-03-19 LAB — TROPONIN I
Troponin I: <0.3 ng/mL (ref <0.30)
Troponin-I: 0.06 ng/mL — ABNORMAL HIGH

## 2014-03-19 LAB — POCT I-STAT 3, ART BLOOD GAS (G3+)
Acid-Base Excess: 4 mmol/L — ABNORMAL HIGH (ref 0.0–2.0)
Bicarbonate: 28.7 mEq/L — ABNORMAL HIGH (ref 20.0–24.0)
O2 Saturation: 94 %
PCO2 ART: 40 mmHg (ref 35.0–45.0)
PH ART: 7.461 — AB (ref 7.350–7.450)
Patient temperature: 97.3
TCO2: 30 mmol/L (ref 0–100)
pO2, Arterial: 65 mmHg — ABNORMAL LOW (ref 80.0–100.0)

## 2014-03-19 LAB — URINALYSIS, COMPLETE
Bilirubin,UR: NEGATIVE
GLUCOSE, UR: NEGATIVE mg/dL (ref 0–75)
Ketone: NEGATIVE
LEUKOCYTE ESTERASE: NEGATIVE
Nitrite: NEGATIVE
PH: 7 (ref 4.5–8.0)
Protein: >=500
SPECIFIC GRAVITY: 1.014 (ref 1.003–1.030)
Squamous Epithelial: NONE SEEN
WBC UR: 6 /HPF (ref 0–5)

## 2014-03-19 LAB — COMPREHENSIVE METABOLIC PANEL
ALBUMIN: 4 g/dL (ref 3.4–5.0)
ANION GAP: 11 (ref 7–16)
AST: 40 U/L — AB (ref 15–37)
Alkaline Phosphatase: 66 U/L
BUN: 37 mg/dL — ABNORMAL HIGH (ref 7–18)
Bilirubin,Total: 0.3 mg/dL (ref 0.2–1.0)
CALCIUM: 8.7 mg/dL (ref 8.5–10.1)
CHLORIDE: 102 mmol/L (ref 98–107)
CREATININE: 4.28 mg/dL — AB (ref 0.60–1.30)
Co2: 27 mmol/L (ref 21–32)
EGFR (Non-African Amer.): 16 — ABNORMAL LOW
GFR CALC AF AMER: 19 — AB
Glucose: 143 mg/dL — ABNORMAL HIGH (ref 65–99)
Osmolality: 291 (ref 275–301)
Potassium: 4.3 mmol/L (ref 3.5–5.1)
SGPT (ALT): 38 U/L
Sodium: 140 mmol/L (ref 136–145)
TOTAL PROTEIN: 8.2 g/dL (ref 6.4–8.2)

## 2014-03-19 LAB — CK: CK TOTAL: 2522 U/L — AB (ref 7–232)

## 2014-03-19 LAB — PHOSPHORUS: PHOSPHORUS: 2.3 mg/dL — AB (ref 2.5–4.9)

## 2014-03-19 LAB — LACTIC ACID, PLASMA: Lactic Acid, Venous: 1.8 mmol/L (ref 0.5–2.2)

## 2014-03-19 LAB — DRUG SCREEN, URINE
AMPHETAMINES, UR SCREEN: NEGATIVE (ref ?–1000)
Barbiturates, Ur Screen: NEGATIVE (ref ?–200)
Benzodiazepine, Ur Scrn: NEGATIVE (ref ?–200)
Cannabinoid 50 Ng, Ur ~~LOC~~: POSITIVE (ref ?–50)
Cocaine Metabolite,Ur ~~LOC~~: NEGATIVE (ref ?–300)
MDMA (Ecstasy)Ur Screen: NEGATIVE (ref ?–500)
Methadone, Ur Screen: NEGATIVE (ref ?–300)
Opiate, Ur Screen: NEGATIVE (ref ?–300)
PHENCYCLIDINE (PCP) UR S: NEGATIVE (ref ?–25)
Tricyclic, Ur Screen: NEGATIVE (ref ?–1000)

## 2014-03-19 LAB — GLUCOSE, CAPILLARY
Glucose-Capillary: 123 mg/dL — ABNORMAL HIGH (ref 70–99)
Glucose-Capillary: 127 mg/dL — ABNORMAL HIGH (ref 70–99)

## 2014-03-19 LAB — MRSA PCR SCREENING: MRSA BY PCR: NEGATIVE

## 2014-03-19 LAB — CBC
HCT: 41.8 % (ref 40.0–52.0)
HCT: 38.2 % — ABNORMAL LOW (ref 39.0–52.0)
HGB: 13.6 g/dL (ref 13.0–18.0)
Hemoglobin: 12.7 g/dL — ABNORMAL LOW (ref 13.0–17.0)
MCH: 30.3 pg (ref 26.0–34.0)
MCH: 29.7 pg (ref 26.0–34.0)
MCHC: 32.6 g/dL (ref 32.0–36.0)
MCHC: 33.2 g/dL (ref 30.0–36.0)
MCV: 93 fL (ref 80–100)
MCV: 89.3 fL (ref 78.0–100.0)
PLATELETS: 271 10*3/uL (ref 150–440)
Platelets: 249 10*3/uL (ref 150–400)
RBC: 4.5 10*6/uL (ref 4.40–5.90)
RBC: 4.28 MIL/uL (ref 4.22–5.81)
RDW: 14.1 % (ref 11.5–14.5)
RDW: 13.8 % (ref 11.5–15.5)
WBC: 14.1 10*3/uL — AB (ref 3.8–10.6)
WBC: 13.2 10*3/uL — ABNORMAL HIGH (ref 4.0–10.5)

## 2014-03-19 LAB — ETHANOL: Ethanol: <3 mg/dL

## 2014-03-19 LAB — APTT
Activated PTT: 27.2 secs (ref 23.6–35.9)
aPTT: 30 seconds (ref 24–37)

## 2014-03-19 LAB — PROTIME-INR
INR: 0.9
INR: 1.05 (ref 0.00–1.49)
PROTHROMBIN TIME: 11.8 s (ref 11.5–14.7)
Prothrombin Time: 13.8 seconds (ref 11.6–15.2)

## 2014-03-19 LAB — TRIGLYCERIDES: TRIGLYCERIDES: 88 mg/dL (ref <150)

## 2014-03-19 LAB — RAPID URINE DRUG SCREEN, HOSP PERFORMED
Amphetamines: NOT DETECTED
Barbiturates: NOT DETECTED
Benzodiazepines: NOT DETECTED
Cocaine: NOT DETECTED
OPIATES: NOT DETECTED
TETRAHYDROCANNABINOL: POSITIVE — AB

## 2014-03-19 LAB — MAGNESIUM: Magnesium: 2 mg/dL

## 2014-03-19 MED ORDER — SENNOSIDES-DOCUSATE SODIUM 8.6-50 MG PO TABS
1.0000 | ORAL_TABLET | Freq: Two times a day (BID) | ORAL | Status: DC
  Administered 2014-03-19 – 2014-03-24 (×9): 1 via ORAL
  Filled 2014-03-19 (×12): qty 1

## 2014-03-19 MED ORDER — STROKE: EARLY STAGES OF RECOVERY BOOK
Freq: Once | Status: AC
  Administered 2014-03-19: 14:00:00
  Filled 2014-03-19: qty 1

## 2014-03-19 MED ORDER — PROPOFOL 10 MG/ML IV EMUL
0.0000 ug/kg/min | INTRAVENOUS | Status: DC
  Administered 2014-03-19 – 2014-03-20 (×5): 40 ug/kg/min via INTRAVENOUS
  Filled 2014-03-19 (×5): qty 100

## 2014-03-19 MED ORDER — PRO-STAT SUGAR FREE PO LIQD
60.0000 mL | Freq: Three times a day (TID) | ORAL | Status: DC
  Administered 2014-03-19 – 2014-03-22 (×11): 60 mL
  Filled 2014-03-19 (×15): qty 60

## 2014-03-19 MED ORDER — LABETALOL HCL 5 MG/ML IV SOLN
10.0000 mg | INTRAVENOUS | Status: DC | PRN
  Administered 2014-03-20 – 2014-03-22 (×6): 20 mg via INTRAVENOUS
  Filled 2014-03-19: qty 8
  Filled 2014-03-19 (×5): qty 4

## 2014-03-19 MED ORDER — ADULT MULTIVITAMIN LIQUID CH
5.0000 mL | Freq: Every day | ORAL | Status: DC
  Administered 2014-03-19 – 2014-03-22 (×4): 5 mL
  Filled 2014-03-19 (×5): qty 5

## 2014-03-19 MED ORDER — NICARDIPINE HCL IN NACL 20-0.86 MG/200ML-% IV SOLN
3.0000 mg/h | INTRAVENOUS | Status: DC
  Administered 2014-03-19: 12.5 mg/h via INTRAVENOUS
  Filled 2014-03-19: qty 200

## 2014-03-19 MED ORDER — SODIUM CHLORIDE 0.9 % IV SOLN
INTRAVENOUS | Status: DC
  Administered 2014-03-19 (×2): via INTRAVENOUS

## 2014-03-19 MED ORDER — NICARDIPINE HCL IN NACL 20-0.86 MG/200ML-% IV SOLN
INTRAVENOUS | Status: AC
  Administered 2014-03-19: 12.5 mg/h via INTRAVENOUS
  Filled 2014-03-19: qty 200

## 2014-03-19 MED ORDER — NICARDIPINE HCL IN NACL 40-0.83 MG/200ML-% IV SOLN
3.0000 mg/h | INTRAVENOUS | Status: DC
  Administered 2014-03-19: 15 mg/h via INTRAVENOUS
  Administered 2014-03-19: 10 mg/h via INTRAVENOUS
  Administered 2014-03-19: 15 mg/h via INTRAVENOUS
  Administered 2014-03-20: 13 mg/h via INTRAVENOUS
  Administered 2014-03-20 (×2): 15 mg/h via INTRAVENOUS
  Administered 2014-03-20: 13 mg/h via INTRAVENOUS
  Administered 2014-03-20: 15 mg/h via INTRAVENOUS
  Administered 2014-03-20: 12.5 mg/h via INTRAVENOUS
  Administered 2014-03-20 (×2): 15 mg/h via INTRAVENOUS
  Administered 2014-03-21: 10 mg/h via INTRAVENOUS
  Administered 2014-03-21: 9 mg/h via INTRAVENOUS
  Administered 2014-03-21: 2 mg/h via INTRAVENOUS
  Administered 2014-03-21: 5 mg/h via INTRAVENOUS
  Filled 2014-03-19 (×14): qty 200

## 2014-03-19 MED ORDER — ACETAMINOPHEN 650 MG RE SUPP: 650.0000 mg | RECTAL | Status: DC | PRN

## 2014-03-19 MED ORDER — CETYLPYRIDINIUM CHLORIDE 0.05 % MT LIQD
7.0000 mL | Freq: Four times a day (QID) | OROMUCOSAL | Status: DC
  Administered 2014-03-19 – 2014-03-24 (×19): 7 mL via OROMUCOSAL

## 2014-03-19 MED ORDER — ACETAMINOPHEN 325 MG PO TABS
650.0000 mg | ORAL_TABLET | ORAL | Status: DC | PRN
  Administered 2014-03-20 – 2014-03-24 (×5): 650 mg via ORAL
  Filled 2014-03-19 (×6): qty 2

## 2014-03-19 MED ORDER — CHLORHEXIDINE GLUCONATE 0.12 % MT SOLN
15.0000 mL | Freq: Two times a day (BID) | OROMUCOSAL | Status: DC
  Administered 2014-03-19 – 2014-03-22 (×7): 15 mL via OROMUCOSAL
  Filled 2014-03-19 (×7): qty 15

## 2014-03-19 MED ORDER — PANTOPRAZOLE SODIUM 40 MG IV SOLR
40.0000 mg | Freq: Every day | INTRAVENOUS | Status: DC
  Administered 2014-03-19 – 2014-03-22 (×4): 40 mg via INTRAVENOUS
  Filled 2014-03-19 (×5): qty 40

## 2014-03-19 MED ORDER — FENTANYL CITRATE 0.05 MG/ML IJ SOLN: 100.0000 ug | INTRAMUSCULAR | Status: DC | PRN

## 2014-03-19 MED ORDER — MORPHINE SULFATE 2 MG/ML IJ SOLN: 1.0000 mg | INTRAMUSCULAR | Status: DC | PRN

## 2014-03-19 MED ORDER — PROPOFOL 10 MG/ML IV EMUL: 0.0000 ug/kg/min | INTRAVENOUS | Status: DC

## 2014-03-19 MED ORDER — VITAL HIGH PROTEIN PO LIQD
1000.0000 mL | ORAL | Status: DC
  Administered 2014-03-19 – 2014-03-20 (×2): 1000 mL
  Administered 2014-03-20: 06:00:00
  Filled 2014-03-19 (×5): qty 1000

## 2014-03-19 NOTE — Progress Notes (Addendum)
[facility] notified with referral. Referral placed in patient chart.

## 2014-03-19 NOTE — Consult Note (Signed)
PULMONARY / CRITICAL CARE MEDICINE   Name: Cristian SersLeonard Alvis Speciale Jr. MRN: [id number] DOB: 10-07-1965    ADMISSION DATE:  04/04/2014 CONSULTATION DATE:  03/23/2014  REFERRING MD :  Franky Machoabbell  CHIEF COMPLAINT:  Basal Ganglia Hemorrhage  INITIAL PRESENTATION: 48 y.o. M [facility]2 after being found unresponsive by his neice.  In ED, he required intubation and was found to have large right basal ganglia bleed.  [facility] further evaluation and management.  PCCM consulted for vent management.     STUDIES:  CT Head 11/2 >>> extensive intraparenchymal hemorrhage arising from right basal ganglia and occupying most of the right basal ganglia except for the caudate nucleus.  There is intraventricular extension with evidence of obstructive hydrocephalus as well as MLS of the 3rd ventricle towards the left.  SIGNIFICANT EVENTS: 11/2 - found down by family, [facility], found to have right basal ganglia bleed, [facility]   HISTORY OF PRESENT ILLNESS:  Pt is encephalopathic; therefore, this HPI is obtained from chart review and is limited. Cristian Pollard is a 48 y.o. M with PMH of HTN, CHF, CKD, who [facility] after he was found unresponsive on his bathroom floor by his niece, unknown downtime. There was reportedly a smoke pipe next to him; however, pt has no known hx of illicit drug use.  He does have a hx of HTN for which he takes multiple meds (clonidine, hydralazine, imdur, labetalol, losartan). En route to ED, EMS reported vomiting and also noticed a burn to pts left upper arm.  In ED, pt was found to have large intraparenchymal hemorrhage as described above.  [facility] for further evaluation and management.  PAST MEDICAL HISTORY :   has a past medical history of Hypertension; CHF (congestive heart failure); and Chronic kidney disease.  has past surgical history that includes Cardiac catheterization. Prior to  Admission medications   Medication Sig Start Date End Date Taking? Authorizing Provider  aspirin 81 MG tablet Take 81 mg by mouth daily.      Historical Provider, MD  cloNIDine (CATAPRES) 0.2 MG tablet TAKE ONE TABLET BY MOUTH TWICE DAILY 01/11/13   Antonieta Ibaimothy J Gollan, MD  hydrALAZINE (APRESOLINE) 100 MG tablet TAKE ONE TABLET BY MOUTH 4 TIMES DAILY 12/29/13   Antonieta Ibaimothy J Gollan, MD  isosorbide mononitrate (IMDUR) 30 MG 24 hr tablet Take 1 tablet (30 mg total) by mouth daily. 10/31/13   Antonieta Ibaimothy J Gollan, MD  labetalol (NORMODYNE) 300 MG tablet TAKE ONE TABLET BY MOUTH TWICE DAILY 08/03/13   Antonieta Ibaimothy J Gollan, MD  losartan (COZAAR) 100 MG tablet Take 1 tablet (100 mg total) by mouth daily. 05/02/12 10/31/13  Antonieta Ibaimothy J Gollan, MD  lovastatin (MEVACOR) 20 MG tablet TAKE ONE TABLET BY MOUTH AT BEDTIME 06/30/13   Antonieta Ibaimothy J Gollan, MD  sildenafil (VIAGRA) 100 MG tablet Take 1 tablet (100 mg total) by mouth daily as needed for erectile dysfunction. 10/27/12   Antonieta Ibaimothy J Gollan, MD   Allergies  Allergen Reactions  . Nitroglycerin     Sick on stomach/vomiting    FAMILY HISTORY:  Family History  Problem Relation Age of Onset  . Pancreatic cancer Mother   . Hypertension Father   . Hypertension Sister     SOCIAL HISTORY:  reports that he has been smoking Cigars.  He does not have any smokeless tobacco history on file. He reports that he drinks  about 1.8 oz of alcohol per week. He reports that he uses illicit drugs (Marijuana).  REVIEW OF SYSTEMS:  Unable to obtain as pt is encephalopathic.  SUBJECTIVE:  Intraventricular drain being placed by neurosurgery.  VITAL SIGNS: Pulse Rate:  [81] 81 (11/02 1110) Resp:  [22] 22 (11/02 1110) BP: (166)/(96) 166/96 mmHg (11/02 1110) SpO2:  [100 %] 100 % (11/02 1110) FiO2 (%):  [40 %] 40 % (11/02 1110) HEMODYNAMICS:   VENTILATOR SETTINGS: Vent Mode:  [-] PRVC FiO2 (%):  [40 %] 40 % Set Rate:  [22 bmp] 22 bmp Vt Set:  [530 mL] 530 mL PEEP:  [5 cmH20] 5  cmH20 Plateau Pressure:  [21 cmH20] 21 cmH20 INTAKE / OUTPUT: Intake/Output    None     PHYSICAL EXAMINATION: General: WDWN male, in NAD. Neuro: Sedated. RASS - 4 HEENT: [place]/AT. Pupils dilated and reactive, sclerae anicteric. Cardiovascular: RRR, no M/R/G.  Lungs: Respirations even and unlabored.  CTA bilaterally, No W/R/R.  Abdomen: BS x 4, soft, NT/ND.  Musculoskeletal: No gross deformities, no edema.   Skin: Intact, warm, no rashes.  Superficial burn to left deltoid region.  LABS:  CBC No results for input(s): WBC, HGB, HCT, PLT in the last 168 hours. Coag's No results for input(s): APTT, INR in the last 168 hours. BMET No results for input(s): NA, K, CL, CO2, BUN, CREATININE, GLUCOSE in the last 168 hours. Electrolytes No results for input(s): CALCIUM, MG, PHOS in the last 168 hours. Sepsis Markers No results for input(s): LATICACIDVEN, PROCALCITON, O2SATVEN in the last 168 hours. ABG No results for input(s): PHART, PCO2ART, PO2ART in the last 168 hours. Liver Enzymes No results for input(s): AST, ALT, ALKPHOS, BILITOT, ALBUMIN in the last 168 hours. Cardiac Enzymes No results for input(s): TROPONINI, PROBNP in the last 168 hours. Glucose No results for input(s): GLUCAP in the last 168 hours.  Imaging No results found.    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 Sedation:  Propofol gtt / Fentanyl PRN. RASS goal: 0 to -1. Daily WUA. Send UDS.  PULMONARY OETT 11/2 >>> A: Acute respiratory failure in setting of intraparenchymal hemorrhage P:   Full mechanical support, wean as able. VAP bundle. SBT in AM if able. ABG and CXR in AM.  CARDIOVASCULAR A:  Troponin leak - 0.06 in ED Hx of HTN on multiple meds Hx CHF P:   Trend troponin. Cardene gtt for goal SBP < 160. Hold outpatient clonidine, hydralazine, imdur, labetalol, losartan.  RENAL A:   CKD - SCr 4.28 on admit, unknown baseline At risk rhabdo given unknown downtime P:   NS @ 100. Check CK, lactate. BMP in AM. May need to consult nephrology.  GASTROINTESTINAL A:   GI prophylaxis Nutrition P:   SUP: Pantoprazole. NPO. Nutrition consult for TF's.  HEMATOLOGIC A:   VTE Prophylaxis P:  SCD's only. CBC in AM.  INFECTIOUS A:   Leukocytosis - likely stress response, no evidence of infection. P:   Monitor clinically.  ENDOCRINE A:   No known issues P:   Monitor glucose on BMP.   Family updated: None available.  Interdisciplinary Family Meeting v Palliative Care Meeting:  Due by: 11/9.   TODAY'S SUMMARY: 48 y.o. M transferred from [facility] with large intraparenchymal hemorrhage.  IVC drain placed by NSGY 11/2.  PCCM consulted for vent management.   Rutherford Guys, PA - [facility] Pgr: [phone]  or [phone] 03/24/2014, 11:37 AM   Attending:  I have seen and examined the patient with nurse practitioner/resident and agree with the note above.   On my exam Cristian Pollard was minimally responsive (GCS 3) on only low dose propofol. Plan full vent support, ventriculostomy today, neurology consult, BP control.  Prognosis guarded  I have personally obtained a history, examined the patient, evaluated laboratory and imaging results, formulated the assessment and plan and placed orders. CRITICAL CARE: The patient is critically ill with multiple organ systems failure and requires high complexity decision making for assessment and support, frequent evaluation and titration of therapies, application of advanced monitoring technologies and extensive interpretation of multiple databases. Critical Care Time devoted to patient care services described in this note is 40 minutes.   [place],  MD [facility] Pager: [phone] Cell: [phone] If no response, call [phone]

## 2014-03-19 NOTE — Progress Notes (Signed)
INITIAL NUTRITION ASSESSMENT  DOCUMENTATION CODES Per approved criteria  -Obesity Unspecified   INTERVENTION: Initiate Vital High Protein @ 10 ml/hr via OG tube   60 ml Prostat QID.    Tube feeding regimen provides 1040 kcal, 141 grams of protein, and 200 ml of H2O.   TF regimen and propofol at current rate providing 1552 total kcal/day (23 kcal/kg of IBW)   NUTRITION DIAGNOSIS: Inadequate oral intake related to inability to eat as evidenced by NPO status  Goal: Enteral nutrition to provide 60-70% of estimated calorie needs (22-25 kcals/kg ideal body weight) and 100% of estimated protein needs, based on ASPEN guidelines for hypocaloric, high protein feeding in critically ill obese individuals  Monitor:  TF initiation and tolerance, weight trends, labs  Reason for Assessment: Consult received to initiate and manage enteral nutrition support.  48 y.o. male  Admitting Dx: Basal ganglia hemorrhage  ASSESSMENT: Pt found down at home, admitted found to have large intraparenchymal hemorrhage. Per neruosurgery pt with very poor prognosis. Family has decided to proceed with ventricular catheter placement.   Patient is currently intubated on ventilator support MV: 14.2 L/min No data recorded.  Propofol: 19.4 ml/hr providing 512 kcal/day of lipid Pt with no signs of fat or muscle depletion.   Height: Ht Readings from Last 1 Encounters:  03/24/2014 5\' 7"  (1.702 m)    Weight: Wt Readings from Last 1 Encounters:  03/30/2014 221 lb 1.9 oz (100.3 kg)    Ideal Body Weight: 67.2 kg   % Ideal Body Weight: 149%  Wt Readings from Last 10 Encounters:  03/27/2014 221 lb 1.9 oz (100.3 kg)  10/31/13 225 lb (102.059 kg)  10/27/12 221 lb 8 oz (100.472 kg)  05/02/12 213 lb 8 oz (96.843 kg)  11/02/11 223 lb 8 oz (101.379 kg)  05/06/11 231 lb (104.781 kg)  03/16/11 237 lb (107.502 kg)  02/03/11 243 lb (110.224 kg)    Usual Body Weight: 225 lb   % Usual Body Weight: 98%  BMI:  Body  mass index is 34.62 kg/(m^2).  Estimated Nutritional Needs: Kcal: 2180 Protein: >/ 134 grams Fluid: > 2 L/day  Skin: WDL  Diet Order: Diet NPO time specified  EDUCATION NEEDS: -No education needs identified at this time   Intake/Output Summary (Last 24 hours) at 04/03/2014 1254 Last data filed at 03/29/2014 1201  Gross per 24 hour  Intake  18.75 ml  Output      0 ml  Net  18.75 ml    Last BM: PTA   Labs:  No results for input(s): NA, K, CL, CO2, BUN, CREATININE, CALCIUM, MG, PHOS, GLUCOSE in the last 168 hours.  CBG (last 3)  No results for input(s): GLUCAP in the last 72 hours.  Scheduled Meds: .  stroke: mapping our early stages of recovery book   Does not apply Once  . antiseptic oral rinse  7 mL Mouth Rinse QID  . chlorhexidine  15 mL Mouth Rinse BID  . pantoprazole (PROTONIX) IV  40 mg Intravenous Daily  . senna-docusate  1 tablet Oral BID    Continuous Infusions: . sodium chloride 80 mL/hr at 04/11/2014 1130  . niCARDipine 15 mg/hr (04/09/2014 1208)  . propofol      Past Medical History  Diagnosis Date  . Hypertension   . CHF (congestive heart failure)   . Chronic kidney disease     renal insufficiency    Past Surgical History  Procedure Laterality Date  . Cardiac catheterization  [facility] 5 yrs ago    Kendell Bane RD, LDN, CNSC [phone] Pager [phone] After Hours Pager

## 2014-03-19 NOTE — H&P (Signed)
Cristian Pollard Cristian Pollard. is an 48 y.o. male.   Chief Complaint: basal ganglia hemorrhage HPI: whom was found down next to a space heater in his home unconscious, and not arousable. Transported via ems to [facility] a head ct showed a right basal ganglia hemorrhage with intraventricular extension. Pupils were not reactive at [place] and the right pupil was dilated. Breathing spontaneously, he was intubated at [facility]. His blood pressure was well over 200 systolic. He was placed on a nicardipine infusion. He was transported to [facility] emergently for further care  Past Medical History  Diagnosis Date  . Hypertension   . CHF (congestive heart failure)   . Chronic kidney disease     renal insufficiency    Past Surgical History  Procedure Laterality Date  . Cardiac catheterization      [facility] 5 yrs ago    Family History  Problem Relation Age of Onset  . Pancreatic cancer Mother   . Hypertension Father   . Hypertension Sister    Social History:  reports that he has been smoking Cigars.  He does not have any smokeless tobacco history on file. He reports that he drinks about 1.8 oz of alcohol per week. He reports that he uses illicit drugs (Marijuana).  Allergies:  Allergies  Allergen Reactions  . Nitroglycerin     Sick on stomach/vomiting    Medications Prior to Admission  Medication Sig Dispense Refill  . aspirin 81 MG tablet Take 81 mg by mouth daily.      . cloNIDine (CATAPRES) 0.2 MG tablet TAKE ONE TABLET BY MOUTH TWICE DAILY 60 tablet 6  . hydrALAZINE (APRESOLINE) 100 MG tablet TAKE ONE TABLET BY MOUTH 4 TIMES DAILY 120 tablet 3  . isosorbide mononitrate (IMDUR) 30 MG 24 hr tablet Take 1 tablet (30 mg total) by mouth daily. 30 tablet 6  . labetalol (NORMODYNE) 300 MG tablet TAKE ONE TABLET BY MOUTH TWICE DAILY 180 tablet 0  . losartan (COZAAR) 100 MG tablet Take 1 tablet (100 mg total) by mouth daily. 90 tablet 11  . lovastatin (MEVACOR) 20 MG tablet TAKE ONE  TABLET BY MOUTH AT BEDTIME 90 tablet 3  . sildenafil (VIAGRA) 100 MG tablet Take 1 tablet (100 mg total) by mouth daily as needed for erectile dysfunction. 3 tablet 6    No results found for this or any previous visit (from the past 48 hour(s)). No results found.  Review of Systems  Unable to perform ROS: acuity of condition    Blood pressure 166/96, pulse 81, resp. rate 22, height 5\' 7"  (1.702 m), weight 100.3 kg (221 lb 1.9 oz), SpO2 100 %. Physical Exam  Constitutional: He appears well-developed and well-nourished.  HENT:  Head: Normocephalic and atraumatic.  Eyes:  Right pupil 4.5 mm, non reactive Left pupil 48mm , not reactive  Cardiovascular: Normal rate.   Neurological: He is unresponsive. A cranial nerve deficit is present.  +Corneals bilaterally +cough Extends with noxious stimuli Not localizing Unable to assess coordination  No response to noxious stimuli in upper extremities      Assessment/Plan Mr.Salasar is unresponsive, has a fairly large hemorrhage, and has a very poor prognosis. I have explained this to the family. I spoke with them about a ventricular catheter, and they would like to proceed. I did inform them that I am not at all sure this will make any material changes in his outcome.  Both Stroke and Critical care will be notified.  Clete Kuch L 03-29-14, 12:31  PM

## 2014-03-19 NOTE — Plan of Care (Signed)
Problem: Consults Goal: Skin Care Protocol Initiated - if Braden Score 18 or less If consults are not indicated, leave blank or document N/A Outcome: Completed/Met Date Met:  04/06/2014 Goal: Nutrition Consult-if indicated Outcome: Completed/Met Date Met:  03/30/2014

## 2014-03-19 NOTE — Consult Note (Signed)
Referring Physician: CABBELL, K    Chief Complaint: patient found unresponsive after an unknown time  HPI: Cristian Pollard. is an 48 y.o. male with a history of hypertension, congestive heart failure and chronic kidney disease who was brought to [facility] emergency room after being found unresponsive at home. Patient was last known well at about 1:30 on 03/18/2014. There's no previous history of stroke nor TIA. CT scan of the head showed a large right intraparenchymal hemorrhage with origin likely in basal ganglia, with intraventricular extension and a 1 cm right to left midline shift. Pupils are noted to be on reactive and right pupil was dilated. Blood pressure was markedly elevated and patient was started on Cardene drip. He was intubated and subsequently transferred to [facility] for further care. CT scan of his head also showed obstructive hydrocephalus. External ventricular drain was placed by Dr. Franky Macho.  LSN: 1:30 PM on 03/18/2014 tPA Given: No: acute ICH; beyond time window for treatment consideration, as well mRankin:  Past Medical History  Diagnosis Date  . Hypertension   . CHF (congestive heart failure)   . Chronic kidney disease     renal insufficiency    Family History  Problem Relation Age of Onset  . Pancreatic cancer Mother   . Hypertension Father   . Hypertension Sister      Medications: I have reviewed the patient's current medications.  ROS: Unavailable.  Physical Examination: Blood pressure 166/96, pulse 81, resp. rate 22, height 5\' 7"  (1.702 m), weight 100.3 kg (221 lb 1.9 oz), SpO2 100 %.  Neurologic Examination: Patient was intubated and on mechanical ventilation as well as sedated with propofol. There was minimal reaction to noxious external stimuli. Left pupil was 3 mm of right pupil 5 mm neither reacted to light. Extraocular movements were absent with oculocephalic maneuvers. Face appeared symmetrical. Occasional decerebrate  posturing was noted with left extremities. Muscle tone was increased in left extremities compared to right extremities, as well. Patient had occasional left upper extremity and shoulder spasms. Deep tendon reflexes were 2+ and brisk, and symmetrical. Plantar responses were flexor bilaterally.  No results found.  Assessment: 48 y.o. male  with hypertension presenting with acute large right intraparenchymal cerebral hemorrhage of basal ganglia origin with intraventricular extension and obstructive hydrocephalus as well as 1 cm midline shift, in addition to hypertensive emergency.  Stroke Risk Factors - family history and hypertension  Plan: 1. EEG to rule out right focal seizure activity 2. Repeat CT scan of the head without contrast in 12 to 24 hours for comparison to rule out increased hemorrhaging as well as worsening of hydrocephalus and increased intracranial pressure 3. PT consult, OT consult, Speech consult when feasible 4. Echocardiogram and carotid Doppler when feasible 5. Prophylactic therapy-None 6. Hemoglobin A1c and fasting lipid panel   C.R. Roseanne Reno, MD [facility] [phone] 03/18/2014, 12:54 PM

## 2014-03-20 ENCOUNTER — Inpatient Hospital Stay (HOSPITAL_COMMUNITY): Payer: Managed Care, Other (non HMO)

## 2014-03-20 DIAGNOSIS — G911 Obstructive hydrocephalus: Secondary | ICD-10-CM

## 2014-03-20 DIAGNOSIS — G936 Cerebral edema: Secondary | ICD-10-CM

## 2014-03-20 DIAGNOSIS — I612 Nontraumatic intracerebral hemorrhage in hemisphere, unspecified: Secondary | ICD-10-CM

## 2014-03-20 DIAGNOSIS — N184 Chronic kidney disease, stage 4 (severe): Secondary | ICD-10-CM

## 2014-03-20 LAB — BASIC METABOLIC PANEL
Anion gap: 17 — ABNORMAL HIGH (ref 5–15)
BUN: 46 mg/dL — ABNORMAL HIGH (ref 6–23)
CALCIUM: 8.9 mg/dL (ref 8.4–10.5)
CO2: 19 meq/L (ref 19–32)
CREATININE: 3.99 mg/dL — AB (ref 0.50–1.35)
Chloride: 106 mEq/L (ref 96–112)
GFR calc non Af Amer: 16 mL/min — ABNORMAL LOW (ref >90)
GFR, EST AFRICAN AMERICAN: 19 mL/min — AB (ref >90)
Glucose, Bld: 128 mg/dL — ABNORMAL HIGH (ref 70–99)
Potassium: 4.3 mEq/L (ref 3.7–5.3)
Sodium: 142 mEq/L (ref 137–147)

## 2014-03-20 LAB — GLUCOSE, CAPILLARY
GLUCOSE-CAPILLARY: 121 mg/dL — AB (ref 70–99)
GLUCOSE-CAPILLARY: 120 mg/dL — AB (ref 70–99)
Glucose-Capillary: 122 mg/dL — ABNORMAL HIGH (ref 70–99)
Glucose-Capillary: 125 mg/dL — ABNORMAL HIGH (ref 70–99)

## 2014-03-20 LAB — TROPONIN I

## 2014-03-20 MED ORDER — FENTANYL CITRATE 0.05 MG/ML IJ SOLN
25.0000 ug | INTRAMUSCULAR | Status: DC | PRN
  Administered 2014-03-20 – 2014-03-22 (×14): 100 ug via INTRAVENOUS
  Filled 2014-03-20 (×14): qty 2

## 2014-03-20 MED ORDER — NICARDIPINE HCL IN NACL 20-0.86 MG/200ML-% IV SOLN
INTRAVENOUS | Status: AC
  Administered 2014-03-20: 20 mg
  Filled 2014-03-20: qty 200

## 2014-03-20 NOTE — Progress Notes (Signed)
Chaplain referred to family meeting by patient nurse. Chaplain learned of everyone's relation to pt. Chaplain encouraged family to take their time in decision making, as did MD and nurses. Family asked chaplain to keep them in prayers.   03/20/14 1300  Clinical Encounter Type  Visited With Family;Health care provider  Visit Type Follow-up;Spiritual support  Referral From Nurse  Spiritual Encounters  Spiritual Needs Emotional;Prayer  Stress Factors  Family Stress Factors Health changes  Ainslie Mazurek, Mayer Masker, Chaplain 03/20/2014 1:48 PM

## 2014-03-20 NOTE — Progress Notes (Signed)
PT Cancellation Note  Patient Details Name: Cristian Pollard. MRN: [id number] DOB: 1966/05/18   Cancelled Treatment:    Reason Eval/Treat Not Completed: Patient not medically ready   Jerie Basford, Alison Murray 03/20/2014, 9:34 AM

## 2014-03-20 NOTE — Plan of Care (Signed)
Problem: Acute Treatment Outcomes Goal: Neuro exam at baseline or improved Outcome: Not Met (add Reason) Clinical status not improving.

## 2014-03-20 NOTE — Progress Notes (Signed)
PULMONARY / CRITICAL CARE MEDICINE   Name: Cristian SersLeonard Alvis Fortunato Jr. MRN: [id number] DOB: [birth year]    ADMISSION DATE:  04/05/2014 CONSULTATION DATE:  03/20/2014  REFERRING MD :  Franky Machoabbell  CHIEF COMPLAINT:  Basal Ganglia Hemorrhage  INITIAL PRESENTATION: 48 y.o. M [facility] after being found unresponsive by his neice.  In ED, he required intubation and was found to have large right basal ganglia bleed.  [facility] and [facility].  PCCM consulted for vent management.     STUDIES:  11/2 CT Head >>> extensive intraparenchymal hemorrhage arising from right basal ganglia and occupying most of the right basal ganglia except for the caudate nucleus.  There is intraventricular extension with evidence of obstructive hydrocephalus as well as MLS of the 3rd ventricle towards the left. 11/3 CT head > large R basal ganglia and deep white matter intraparenchymal hemorrhage with intraventricular hemorrhage and associated mass effect with edema, 12 mm midline shift, ventriculostomy tube in place  SIGNIFICANT EVENTS: 11/2 - found down by family, [facility], found to have right basal ganglia bleed, [facility]; UDS positive for marijuana, but not cocaine; ventriculostomy drain placed by Dr. Franky Machoabbell   SUBJECTIVE:  Intraventricular drain placed yesterday, minimal response to external stimuli  VITAL SIGNS: Temp:  [97.1 F (36.2 C)-97.6 F (36.4 C)] 97.1 F (36.2 C) (11/03 0741) Pulse Rate:  [72-105] 87 (11/03 0900) Resp:  [16-30] 28 (11/03 0900) BP: (126-187)/(66-123) 151/81 mmHg (11/03 0900) SpO2:  [99 %-100 %] 100 % (11/03 0900) FiO2 (%):  [40 %] 40 % (11/03 0900) Weight:  [100.3 kg (221 lb 1.9 oz)-101.5 kg (223 lb 12.3 oz)] 101.5 kg (223 lb 12.3 oz) (11/03 0500) HEMODYNAMICS:   VENTILATOR SETTINGS: Vent Mode:  [-] CPAP FiO2 (%):  [40 %] 40 % Set Rate:  [22 bmp] 22 bmp Vt Set:  [530 mL] 530 mL PEEP:  [5 cmH20] 5 cmH20 Pressure  Support:  [10 cmH20] 10 cmH20 Plateau Pressure:  [17 cmH20-21 cmH20] 17 cmH20 INTAKE / OUTPUT: Intake/Output      11/02 0701 - 11/03 0700 11/03 0701 - 11/04 0700   I.V. (mL/kg) 3716.3 (36.6) 310 (3.1)   NG/GT 280 20   Total Intake(mL/kg) 3996.3 (39.4) 330 (3.3)   Urine (mL/kg/hr) 1190 100 (0.4)   Drains 249 8 (0)   Total Output 1439 108   Net +2557.3 +222          PHYSICAL EXAMINATION: General: on vent, no distress HEENT: Ventriculostomy drain in place, ETT in place PULM: CTA B CV: RRR, no mgr AB: BS+, soft, nontender Ext: no edema Neuro: does not open eyes, flex R foot to pain, extend left foot to pain, twitch only to pain in hands GCS 5  LABS:  CBC  Recent Labs Lab 10/21/13 2153  WBC 13.2*  HGB 12.7*  HCT 38.2*  PLT 249   Coag's  Recent Labs Lab 10/21/13 2153  APTT 30  INR 1.05   BMET No results for input(s): NA, K, CL, CO2, BUN, CREATININE, GLUCOSE in the last 168 hours. Electrolytes No results for input(s): CALCIUM, MG, PHOS in the last 168 hours. Sepsis Markers  Recent Labs Lab 10/21/13 1312  LATICACIDVEN 1.8   ABG  Recent Labs Lab 10/21/13 1319  PHART 7.461*  PCO2ART 40.0  PO2ART 65.0*   Liver Enzymes No results for input(s): AST, ALT, ALKPHOS, BILITOT, ALBUMIN in the last 168 hours. Cardiac Enzymes  Recent Labs Lab 10/21/13  1312 03/24/2014 2153 04/11/2014 2335  TROPONINI <0.30 <0.30 <0.30   Glucose  Recent Labs Lab 04/05/2014 1931 04/07/2014 2343 03/20/14 0311 03/20/14 0740  GLUCAP 123* 127* 121* 122*    Imaging Dg Abd Portable 1v  03/23/2014   CLINICAL DATA:  Check OG tube position  EXAM: PORTABLE ABDOMEN - 1 VIEW  COMPARISON:  None  FINDINGS: The tip of the OG tube is in the expected location of the distal stomach or proximal duodenum. The bowel gas pattern appears nonobstructed.  IMPRESSION: Tip of OG tube is in the distal stomach or proximal duodenum.   Electronically Signed   By: Signa Kell M.D.   On: 04/05/2014 13:14       ASSESSMENT / PLAN:  NEUROLOGIC Intraventricular drain 11/2 >>> A:   Large intraparenchymal hemorrhage arising from right basal ganglia with obstructive hydrocephalus and MLS to the left> presumably due to hypertension Acute encephalopathy due to above P:   Management per neurosurgery. Sedation:  Minimize, propofol as needed RASS goal: 0  Daily WUA. Neurology to update family today re prognosis  PULMONARY OETT 11/2 >>> A: Acute respiratory failure in setting of intraparenchymal hemorrhage Weaning on PSV but mental status limits extubation P:   Full mechanical support, PSV daily VAP bundle. ABG and CXR in AM. No plans for extubation unless palliative  CARDIOVASCULAR A:  Troponin leak - 0.06 in ED Hx of HTN on multiple meds Hypertensive cardiomyopathy Systolic CHF (2012 EF 35-40%), currently compensated P:  Trend troponin. Cardene gtt for goal SBP < 160. Hold outpatient clonidine, hydralazine, imdur, labetalol, losartan for now  RENAL A:   CKD - SCr 4.28 on admit, unknown baseline> not a dialysis candidate Mildly elevated CK P:   Hold IVF given brain edema Check BMET BMP now and in AM.  GASTROINTESTINAL A:   GI prophylaxis Nutrition P:   SUP: Pantoprazole. Tube feeding  HEMATOLOGIC A:   VTE Prophylaxis P:  SCD's only. CBC in AM.  INFECTIOUS A:   Leukocytosis - likely stress response, no evidence of infection. P:   Monitor clinically.  ENDOCRINE A:   No known issues P:   Monitor glucose on BMP.   Family updated: Neurosurgery updated 11/2, neurology to update on 11/3  Interdisciplinary Family Meeting v Palliative Care Meeting:  Due by: 11/9.   TODAY'S SUMMARY: 48 y.o. M [facility] with large intraparenchymal hemorrhage.  IVC drain placed by NSGY 11/2.  PCCM consulted for vent management.    On my exam Cristian Pollard was minimally responsive (GCS 5) off of all sedation. Plan full vent support for now with ongoing BP  control.  Repeat BMET now.  Not a candidate for hemodialysis given dismal neurologic prognosis.  Likely needs withdrawal of support  I have personally obtained a history, examined the patient, evaluated laboratory and imaging results, formulated the assessment and plan and placed orders. CRITICAL CARE: The patient is critically ill with multiple organ systems failure and requires high complexity decision making for assessment and support, frequent evaluation and titration of therapies, application of advanced monitoring technologies and extensive interpretation of multiple databases. Critical Care Time devoted to patient care services described in this note is 40 minutes.   Heber Rocheport, MD [place] [facility] Pager: [phone] Cell: [phone] If no response, call [phone]

## 2014-03-20 NOTE — Progress Notes (Signed)
Chaplain referred to family by pt nurse. Pt has large supportive family gathered together. Chaplain introduced herself and her services. Chaplain offered prayer. Will continue to follow. Page chaplain as needed.    03/20/14 1200  Clinical Encounter Type  Visited With Family;Health care provider  Visit Type Initial;Spiritual support  Referral From Nurse  Consult/Referral To Chaplain  Recommendations Follow Up  Spiritual Encounters  Spiritual Needs Emotional;Prayer  Stress Factors  Family Stress Factors Major life changes  Sparkles Mcneely, Mayer Masker, Chaplain 03/20/2014 12:03 PM

## 2014-03-20 NOTE — Plan of Care (Signed)
Problem: Acute Treatment Outcomes Goal: Nausea and vomiting controlled Outcome: Completed/Met Date Met:  03/20/14

## 2014-03-20 NOTE — Plan of Care (Signed)
Problem: Acute Treatment Outcomes Goal: Neuro exam at baseline or improved Outcome: Not Progressing     

## 2014-03-20 NOTE — Plan of Care (Signed)
Problem: Acute Treatment Outcomes Goal: Pain controlled Outcome: Completed/Met Date Met:  03/20/14

## 2014-03-20 NOTE — Progress Notes (Signed)
STROKE TEAM PROGRESS NOTE   HISTORY Selig Steuer. is an 48 y.o. male with a history of hypertension, congestive heart failure and chronic kidney disease who was brought to [facility] emergency room after being found unresponsive at home. Patient was last known well at about 1:30 on 03/18/2014. There's no previous history of stroke nor TIA. CT scan of the head showed a large right intraparenchymal hemorrhage with origin likely in basal ganglia, with intraventricular extension and a 1 cm right to left midline shift. Pupils are noted to be on reactive and right pupil was dilated. Blood pressure was markedly elevated and patient was started on Cardene drip. He was intubated and subsequently transferred to [facility] for further care. CT scan of his head also showed obstructive hydrocephalus. External ventricular drain was placed by Dr. Franky Macho. Patient was not administered TPA secondary to acute ICH; beyond time window for treatment consideration, as well. He was admitted to the neuro ICU for further evaluation and treatment.   SUBJECTIVE (INTERVAL HISTORY) His sisters x 2  are at the bedside.  Overall he feels his condition is gradually worsening. Repeat CT scan of the head this a.m. still shows a large 4.8 x 4.6 cm right basal ganglia hematoma with cytotoxic edema and 13 mm right-to-left midline shift with hydrocephalus.heel and intraventricular catheters placed by Dr. Mikal Plane last night but it stopped functioning. This morning he repositioned the catheter and it worked only little while and is no longer working   OBJECTIVE Temp:  [97.1 F (36.2 C)-97.6 F (36.4 C)] 97.1 F (36.2 C) (11/03 0741) Pulse Rate:  [72-105] 80 (11/03 0815) Cardiac Rhythm:  [-] Normal sinus rhythm (11/03 0815) Resp:  [16-30] 26 (11/03 0815) BP: (126-187)/(66-123) 135/70 mmHg (11/03 0815) SpO2:  [99 %-100 %] 100 % (11/03 0815) FiO2 (%):  [40 %] 40 % (11/03 0815) Weight:  [100.3 kg (221 lb 1.9  oz)-101.5 kg (223 lb 12.3 oz)] 101.5 kg (223 lb 12.3 oz) (11/03 0500)   Recent Labs Lab 03/24/2014 1931 03/30/2014 2343 03/20/14 0311 03/20/14 0740  GLUCAP 123* 127* 121* 122*   No results for input(s): NA, K, CL, CO2, GLUCOSE, BUN, CREATININE, CALCIUM, MG, PHOS in the last 168 hours. No results for input(s): AST, ALT, ALKPHOS, BILITOT, PROT, ALBUMIN in the last 168 hours.  Recent Labs Lab 04/01/2014 2153  WBC 13.2*  HGB 12.7*  HCT 38.2*  MCV 89.3  PLT 249    Recent Labs Lab 03/23/2014 1312 03/24/2014 2153 04/16/2014 2335  CKTOTAL 2522*  --   --   TROPONINI <0.30 <0.30 <0.30    Recent Labs  04/10/2014 2153  LABPROT 13.8  INR 1.05   No results for input(s): COLORURINE, LABSPEC, PHURINE, GLUCOSEU, HGBUR, BILIRUBINUR, KETONESUR, PROTEINUR, UROBILINOGEN, NITRITE, LEUKOCYTESUR in the last 72 hours.  Invalid input(s): APPERANCEUR     Component Value Date/Time   TRIG 88 04/12/2014 1252   HDL 46 11/02/2011 0835   CHOLHDL 3.2 11/02/2011 0835   LDLCALC 86 11/02/2011 0835   No results found for: HGBA1C    Component Value Date/Time   LABOPIA NONE DETECTED 04/08/2014 1143   COCAINSCRNUR NONE DETECTED 03/21/2014 1143   LABBENZ NONE DETECTED 03/18/2014 1143   AMPHETMU NONE DETECTED 03/23/2014 1143   THCU POSITIVE* 04/01/2014 1143   LABBARB NONE DETECTED 04/10/2014 1143    No results for input(s): ETH in the last 168 hours.  Ct Head Wo Contrast 03/20/2014   Large right basal ganglia and deep white matter intraparenchymal hemorrhage  with intraventricular hemorrhage and associated mass effect with edema. Since the previous study, ventricles have decompressed in size following placement of shunt tube. Otherwise, no significant change since prior study.    Portable Chest Xray 03/20/2014   Endotracheal tube tip 4 cm above the carina.  Nasogastric tube courses below the diaphragm. Tip is not included on the present exam.  Central pulmonary vascular prominence.  Left base subsegmental  atelectasis.  Heart size top-normal.      Dg Abd Portable 1v 03/21/2014    Tip of OG tube is in the distal stomach or proximal duodenum.      PHYSICAL EXAM Middle aged obese african [place] male who is intubated. . Afebrile. Head is nontraumatic. Neck is supple without bruit.   Cardiac exam no murmur or gallop. Lungs are clear to auscultation. Distal pulses are well felt.  Neurological Exam : comatose and unresponsive. Eyes are closed. Right pupil is 4 mm fixed and left pupil 2 mm fixed and nonreactive. Dolls eye moments are absent. Corneal reflexes are present bilaterally. Face is symmetric. Tongue is midline. No cough or gag noted. Vision has mild respiratory effort above the ventilator. Motor response to sternal rub. Occasional intermittent extensive vaginal upper extremities.. Minimum withdrawal in all extremities to painful stimuli. The plantars are mute. ASSESSMENT/PLAN Mr. Tillman SersLeonard Alvis Brogden Jr. is a 48 y.o. male with history of hypertension, congestive heart failure and chronic kidney disease presenting after being found unresponsive at home after an unknown time. He did not receive IV t-PA due to hemorrhage.   Stroke:  Non-dominant large right basal ganglia hemorrhage with intraventricular extension, cytotoxic cerebral edema with transfalcine herniation and hydrocephalus  IVC placed 04/15/2014  SCDs for VTE prophylaxis  Diet NPO time specified   aspirin 81 mg orally every day prior to admission, now on no antithrombotics secondary to hemorrhage  Ongoing aggressive risk factor management  Resultant comatose state with poor neurological exam  Therapy recommendations:  On hold  Acute respiratory failure  Intubated  CCM following  Hypertension  Multiple home meds  Unstable, BP elevated  Placed on cardene   SBP goal <160  Hyperlipidemia  Home meds:  mevacor 20   Diabetes     Other Stroke Risk Factors Cigar smoker ETOH use THC positive this  admission . Obesity, Body mass index is 35.04 kg/(m^2).   Other Active Problems  Hx CHF  CKD, Cr 4.28 on admission  Rhabdo, found down, CK 2522  Leukocytosis, no evidence of infection  Hospital day # 1 I had a long 30 minute discussion with multiple family members onto separate occasions including 2 sisters and brother in law he initially then subsequently followed by his younger brother and the rest of the family. I explained his poor prognosis, and neurological exam, CT scan findings and chances of surviving this catastrophic hemorrhage and making a meaningful neurological recovery being close to none. The family would like some more time to think about withdrawal of life support. They agree to make him DO NOT RESUSCITATE.-And that is possible that he is likely to further clinically worsen and become brain dead and they understand this. This patient is critically ill and at significant risk of neurological worsening, death and care requires constant monitoring of vital signs, hemodynamics,respiratory and cardiac monitoring,review of multiple databases, neurological assessment, discussion with family, other specialists and medical decision making of high complexity.I have made any additions or clarifications directly to the above note.  I spent 70 minutes of neurocritical care time  in the care of  this patient. Delia Heady, MD  To contact Stroke Continuity provider, please refer to [email or web address]. After hours, contact General Neurology

## 2014-03-20 NOTE — Plan of Care (Signed)
Problem: Acute Treatment Outcomes Goal: BP within ordered parameters Outcome: Progressing     

## 2014-03-20 NOTE — Progress Notes (Signed)
SLP Cancellation Note  Patient Details Name: Cristian Pollard. MRN: [id number] DOB: January 07, 1966   Cancelled treatment:       Reason Eval/Treat Not Completed: Patient not medically ready   Raye Slyter, Riley Nearing 03/20/2014, 7:21 AM

## 2014-03-20 NOTE — Plan of Care (Signed)
Problem: Consults Goal: Intracranial Hemorrhage Patient Education See Patient Education Module for education specifics.  Outcome: Completed/Met Date Met:  03/20/14

## 2014-03-20 NOTE — Progress Notes (Signed)
OT Cancellation Note  Patient Details Name: Cristian Pollard. MRN: [id number] DOB: 01-Sep-1965   Cancelled Treatment:    Reason Eval/Treat Not Completed: Other (comment) Will sign off at this time due to medical complexity.  If status changes, please reorder OT. Thank you.  [facility] Rhema Boyett, OTR/L  [phone] 03/20/2014 03/20/2014, 3:32 PM

## 2014-03-20 NOTE — Plan of Care (Signed)
Problem: Acute Treatment Outcomes Goal: Prognosis discussed with family/patient as appropriate Outcome: Completed/Met Date Met:  03/20/14 Family plan of care meeting at 13:00 with Dr. Leonie Man

## 2014-03-20 NOTE — Progress Notes (Signed)
Patient ID: Cristian Sers., male   DOB: Sep 19, 1965, 48 y.o.   MRN: [id number] BP 143/72 mmHg  Pulse 86  Temp(Src) 97.4 F (36.3 C) (Oral)  Resp 24  Ht 5\' 7"  (1.702 m)  Wt 101.5 kg (223 lb 12.3 oz)  BMI 35.04 kg/m2  SpO2 100% Sedated and intubated. With propofol decreased he had minimal flexion to noxious stimuli in the right upper extremity Comatose, pupils unequal, not reactive right pupil ~4.46mm, left 2.45mm No oculocephalics Corneal on right, equivocal on the left +cough, gag Continues to do poorly. Ventricular catheter stopped draining this am, with irrigation it was working again. New head ct shows ventricles decompressed, large amount of remaining blood within lateral ventricles, third, and fourth ventricles.  Prognosis remains grim.

## 2014-03-20 NOTE — Progress Notes (Signed)
Patient's IVC drain stopped draining and is no longer pulsating. Dr. Franky Macho notified and new orders carried out. Cristian Pollard

## 2014-03-20 NOTE — Progress Notes (Signed)
Pt. Was transported to CT & back to 3M14 without any complications.  

## 2014-03-20 NOTE — Progress Notes (Signed)
OT Cancellation Note  Patient Details Name: Cristian Pollard. MRN: [id number] DOB: 1965/10/01   Cancelled Treatment:    Reason Eval/Treat Not Completed: Patient not medically ready (BR)  Harolyn Rutherford  [phone] 03/20/2014, 8:09 AM

## 2014-03-21 DIAGNOSIS — I610 Nontraumatic intracerebral hemorrhage in hemisphere, subcortical: Principal | ICD-10-CM

## 2014-03-21 LAB — BASIC METABOLIC PANEL
ANION GAP: 16 — AB (ref 5–15)
BUN: 62 mg/dL — ABNORMAL HIGH (ref 6–23)
CHLORIDE: 110 meq/L (ref 96–112)
CO2: 19 meq/L (ref 19–32)
Calcium: 8.8 mg/dL (ref 8.4–10.5)
Creatinine, Ser: 4.27 mg/dL — ABNORMAL HIGH (ref 0.50–1.35)
GFR calc Af Amer: 17 mL/min — ABNORMAL LOW (ref >90)
GFR calc non Af Amer: 15 mL/min — ABNORMAL LOW (ref >90)
Glucose, Bld: 119 mg/dL — ABNORMAL HIGH (ref 70–99)
Potassium: 4.6 mEq/L (ref 3.7–5.3)
SODIUM: 145 meq/L (ref 137–147)

## 2014-03-21 LAB — GLUCOSE, CAPILLARY
GLUCOSE-CAPILLARY: 111 mg/dL — AB (ref 70–99)
GLUCOSE-CAPILLARY: 113 mg/dL — AB (ref 70–99)
GLUCOSE-CAPILLARY: 117 mg/dL — AB (ref 70–99)
Glucose-Capillary: 103 mg/dL — ABNORMAL HIGH (ref 70–99)
Glucose-Capillary: 112 mg/dL — ABNORMAL HIGH (ref 70–99)
Glucose-Capillary: 122 mg/dL — ABNORMAL HIGH (ref 70–99)

## 2014-03-21 MED ORDER — LABETALOL HCL 300 MG PO TABS
300.0000 mg | ORAL_TABLET | Freq: Two times a day (BID) | ORAL | Status: DC
  Administered 2014-03-21 – 2014-03-22 (×2): 300 mg
  Filled 2014-03-21 (×3): qty 1

## 2014-03-21 MED ORDER — SODIUM CHLORIDE 0.9 % IV SOLN
INTRAVENOUS | Status: DC
  Administered 2014-03-21 – 2014-03-22 (×2): via INTRAVENOUS

## 2014-03-21 MED ORDER — HYDRALAZINE HCL 50 MG PO TABS
50.0000 mg | ORAL_TABLET | Freq: Three times a day (TID) | ORAL | Status: DC
  Administered 2014-03-21 – 2014-03-22 (×3): 50 mg
  Filled 2014-03-21 (×6): qty 1

## 2014-03-21 NOTE — Progress Notes (Signed)
PT Cancellation and Discharge Note  Patient Details Name: Cristian Pollard. MRN: [id number] DOB: [birth year]   Cancelled Treatment:    Reason Eval/Treat Not Completed: PT screened, no needs identified, will sign off.  Per MD notes pt with poor prognosis and family discussing withdrawal of care.  Will sign of PT at this time.     Clinton Dragone, Alison Murray 03/21/2014, 7:59 AM

## 2014-03-21 NOTE — Progress Notes (Signed)
Patient ID: Cristian Sers., male   DOB: 12-13-1965, 48 y.o.   MRN: [id number] BP 138/83 mmHg  Pulse 86  Temp(Src) 98.8 F (37.1 C) (Axillary)  Resp 33  Ht 5\' 7"  (1.702 m)  Wt 102.6 kg (226 lb 3.1 oz)  BMI 35.42 kg/m2  SpO2 91% Comatose, pupils unequal and not reactive. Weak corneal, +cough Extends lower extremities to noxious stimuli, decerebrate with noxious stimuli to upper extremities Ventricular catheter is working, bloody drainage Slight worsening in neuro exam. Do not believe he will make any significant improvements. I have spoken with family, they would like to continue full medical support.

## 2014-03-21 NOTE — Progress Notes (Signed)
PULMONARY / CRITICAL CARE MEDICINE   Name: Cristian SersLeonard Alvis Illescas Jr. MRN: [id number] DOB: 1966-03-22    ADMISSION DATE:  04/01/2014 CONSULTATION DATE:  03/21/2014  REFERRING MD :  Franky Machoabbell  CHIEF COMPLAINT:  Basal Ganglia Hemorrhage  INITIAL PRESENTATION: 48 y.o. M [facility] after being found unresponsive by his neice.  In ED, he required intubation and was found to have large right basal ganglia bleed.  [facility] and management.  PCCM consulted for vent management.     STUDIES:  11/2 CT Head >>> extensive intraparenchymal hemorrhage arising from right basal ganglia and occupying most of the right basal ganglia except for the caudate nucleus.  There is intraventricular extension with evidence of obstructive hydrocephalus as well as MLS of the 3rd ventricle towards the left. 11/3 CT head > large R basal ganglia and deep white matter intraparenchymal hemorrhage with intraventricular hemorrhage and associated mass effect with edema, 12 mm midline shift, ventriculostomy tube in place  SIGNIFICANT EVENTS: 11/2 - found down by family, [facility], found to have right basal ganglia bleed, [facility]; UDS positive for marijuana, but not cocaine; ventriculostomy drain placed by Dr. Franky Machoabbell 11/3 - made DNR   SUBJECTIVE:  Made DNR 11/4, family considering withdrawal of care  VITAL SIGNS: Temp:  [97.1 F (36.2 C)-100.6 F (38.1 C)] 99.1 F (37.3 C) (11/04 0400) Pulse Rate:  [74-122] 87 (11/04 0400) Resp:  [15-53] 22 (11/04 0400) BP: (121-184)/(64-95) 143/75 mmHg (11/04 0400) SpO2:  [97 %-100 %] 100 % (11/04 0400) FiO2 (%):  [30 %-40 %] 30 % (11/04 0400) Weight:  [102.6 kg (226 lb 3.1 oz)] 102.6 kg (226 lb 3.1 oz) (11/04 0433) HEMODYNAMICS:   VENTILATOR SETTINGS: Vent Mode:  [-] PRVC FiO2 (%):  [30 %-40 %] 30 % Set Rate:  [22 bmp] 22 bmp Vt Set:  [530 mL] 530 mL PEEP:  [5 cmH20] 5 cmH20 Pressure Support:  [10 cmH20] 10  cmH20 Plateau Pressure:  [15 cmH20] 15 cmH20 INTAKE / OUTPUT: Intake/Output      11/03 0701 - 11/04 0700   I.V. (mL/kg) 1658.2 (16.2)   NG/GT 420   Total Intake(mL/kg) 2078.2 (20.3)   Urine (mL/kg/hr) 2080 (0.8)   Drains 66 (0)   Total Output 2146   Net -67.8         PHYSICAL EXAMINATION: General: on vent, no distress HEENT: Ventriculostomy drain in place, ETT in place PULM: CTA B CV: RRR, no mgr AB: BS+, soft, nontender Ext: no edema Neuro: no response to pain for me, GCS 3  LABS:  CBC  Recent Labs Lab 09/03/13 2153  WBC 13.2*  HGB 12.7*  HCT 38.2*  PLT 249   Coag's  Recent Labs Lab 09/03/13 2153  APTT 30  INR 1.05   BMET  Recent Labs Lab 03/20/14 1030 03/21/14 0230  NA 142 145  K 4.3 4.6  CL 106 110  CO2 19 19  BUN 46* 62*  CREATININE 3.99* 4.27*  GLUCOSE 128* 119*   Electrolytes  Recent Labs Lab 03/20/14 1030 03/21/14 0230  CALCIUM 8.9 8.8   Sepsis Markers  Recent Labs Lab 09/03/13 1312  LATICACIDVEN 1.8   ABG  Recent Labs Lab 09/03/13 1319  PHART 7.461*  PCO2ART 40.0  PO2ART 65.0*   Liver Enzymes No results for input(s): AST, ALT, ALKPHOS, BILITOT, ALBUMIN in the last 168 hours. Cardiac Enzymes  Recent Labs Lab 09/03/13 1312 09/03/13 2153 09/03/13 2335  TROPONINI <0.30 <0.30 <0.30   Glucose  Recent Labs Lab 03/20/14 0311 03/20/14 0740 03/20/14 1145 03/20/14 1950 03/20/14 2346 03/21/14 0344  GLUCAP 121* 122* 125* 120* 117* 111*    Imaging Ct Head Wo Contrast  03/20/2014   CLINICAL DATA:  Patient found on floor nonresponsive.  EXAM: CT HEAD WITHOUT CONTRAST  TECHNIQUE: Contiguous axial images were obtained from the base of the skull through the vertex without intravenous contrast.  COMPARISON:  04-18-14 from [facility]  FINDINGS: There is a large acute intraparenchymal hemorrhage in the right basal ganglia and deep white matter. Hematoma measures about 4.7 x 4.8 cm. Large amount of  blood demonstrated in the lateral, third, and fourth ventricles bilaterally. No subarachnoid or subdural blood. Small amount of edema in the right cerebral hemisphere. There is mass effect with effacement of right cerebral sulci and right to left midline shift of approximately 12 mm. Left trans frontal intraventricular shunt tube arising through a left frontal craniostomy. No depressed skull fractures. Mucosal thickening in the paranasal sinuses with retention cyst in the right maxillary antrum and right sphenoid sinus. Mastoid air cells are not opacified.  IMPRESSION: Large right basal ganglia and deep white matter intraparenchymal hemorrhage with intraventricular hemorrhage and associated mass effect with edema. Since the previous study, ventricles have decompressed in size following placement of shunt tube. Otherwise, no significant change since prior study.  These results were called by telephone at the time of interpretation on 03/20/2014 at 5:14 am to [facility], the patient's nurse on [id number] Neuro ICU, who verbally acknowledged these results.   Electronically Signed   By: Burman Nieves M.D.   On: 03/20/2014 05:22   Portable Chest Xray  03/20/2014   CLINICAL DATA:  48 year old male with high blood pressure and history of congestive heart failure/chronic renal disease. Intracranial bleed. Intubated. Initial encounter.  EXAM: PORTABLE CHEST - 1 VIEW  COMPARISON:  No comparison chest x-ray.  FINDINGS: Endotracheal tube tip 4 cm above the carina.  Nasogastric tube courses below the diaphragm. Tip is not included on the present exam.  Central pulmonary vascular prominence.  Left base subsegmental atelectasis.  Heart size top-normal.  Slightly tortuous aorta.  IMPRESSION: Endotracheal tube tip 4 cm above the carina.  Nasogastric tube courses below the diaphragm. Tip is not included on the present exam.  Central pulmonary vascular prominence.  Left base subsegmental atelectasis.  Heart size top-normal.    Electronically Signed   By: Bridgett Larsson M.D.   On: 03/20/2014 07:53      ASSESSMENT / PLAN:  NEUROLOGIC Intraventricular drain 11/2 >>> A:   Large intraparenchymal hemorrhage arising from right basal ganglia with obstructive hydrocephalus and MLS to the left> presumably due to hypertension Acute encephalopathy due to above P:   Management per neurosurgery/neurology Sedation:  Minimize, propofol as needed RASS goal: 0  Daily WUA. Neurology to update family today re prognosis  PULMONARY OETT 11/2 >>> A: Acute respiratory failure in setting of intraparenchymal hemorrhage Weaning on PSV but mental status limits extubation P:   Full mechanical support, PSV daily VAP bundle. ABG and CXR in AM. No plans for extubation unless palliative  CARDIOVASCULAR A:  Hx of HTN on multiple meds Hypertensive cardiomyopathy Systolic CHF (2012 EF 35-40%), currently compensated P:  Trend troponin. Cardene gtt for goal SBP < 160. Hold outpatient clonidine, hydralazine, imdur, labetalol, losartan for now  RENAL A:   CKD - at baseline? Mildly elevated CK P:   Check BMET BMP now and  in AM.  GASTROINTESTINAL A:   GI prophylaxis Nutrition P:   SUP: Pantoprazole Tube feeding  HEMATOLOGIC A:   VTE Prophylaxis P:  SCD's only. CBC in AM.  INFECTIOUS A:   Fever overnight P:   Will work up/treat if plans are for aggressive measures, otherwise hold off for now  ENDOCRINE A:   No known issues P:   Monitor glucose on BMP.   Family updated: neurology discussed on 11/3, patient DNR, now family deciding about withdrawal of care  Interdisciplinary Family Meeting v Palliative Care Meeting:  Due by: 11/9.   TODAY'S SUMMARY: 48 y.o. M transferred from [facility] with large intraparenchymal hemorrhage.  IVC drain placed by NSGY 11/2.  PCCM consulted for vent management.  Awaiting decision from family regarding withdrawal of care.  I have personally obtained a history, examined the  patient, evaluated laboratory and imaging results, formulated the assessment and plan and placed orders. CRITICAL CARE: The patient is critically ill with multiple organ systems failure and requires high complexity decision making for assessment and support, frequent evaluation and titration of therapies, application of advanced monitoring technologies and extensive interpretation of multiple databases. Critical Care Time devoted to patient care services described in this note is 35 minutes.   Heber Reed, MD [place] [facility] Pager: [phone] Cell: [phone] If no response, call [phone]

## 2014-03-21 NOTE — Progress Notes (Signed)
eLink Physician-Brief Progress Note Patient Name: Cristian Pollard. DOB: [birth year] MRN: [id number]   Date of Service  03/21/2014  HPI/Events of Note  Remain son cardene gtt Pain treated with fent int  eICU Interventions  Resume home emds - labetalol & hydralazine -at lower doses     Intervention Category Intermediate Interventions: Hypertension - evaluation and management  ALVA,RAKESH V. 03/21/2014, 3:25 PM

## 2014-03-21 NOTE — Op Note (Signed)
   9:49 AM  PATIENT:  Tillman Sers.  48 y.o. male  PRE-OPERATIVE DIAGNOSIS: Hydrocephalus, ICH  POST-OPERATIVE DIAGNOSIS:  Same  PROCEDURE:  Right frontal ventricular catheter placement  SURGEON:  Latrina Guttman  ASSISTANTS:none  ANESTHESIA:   local  EBL:  Total I/O In: 35 [I.V.:25; NG/GT:10] Out: 632 [Urine:625; Drains:7]  BLOOD ADMINISTERED:none      DRAINS: Ventriculostomy Drain in the right lateral ventricle.   SPECIMEN:  No Specimen  DICTATION: Mr. Hyman Bible had his head shaved and then prepared in a sterile manner. I draped him in a sterile manner. I injected lidocaine into the planned coronal incision in line with the right pupillary line anterior to the tragus. I opened the skin with a 15 blade. I used the hand twist drill to create a burr hole. I opened the dura with the 20 gauge spinal needle. I passed the ventricular catheter into the lateral ventricle. There was brisk flow of spinal fluid. I tunneled the catheter posteriorly and secured it to the skin at the exit. I approximated the scalp edges with nylon sutures. I placed a sterile dressing, then connected the catheter to the drainage system.   PLAN OF CARE: Admit to inpatient   PATIENT DISPOSITION:  PACU - hemodynamically stable.

## 2014-03-21 NOTE — Progress Notes (Signed)
STROKE TEAM PROGRESS NOTE   HISTORY Cristian SersLeonard Alvis Laursen Jr. is an 48 y.o. male with a history of hypertension, congestive heart failure and chronic kidney disease who was brought to [facility] emergency room after being found unresponsive at home. Patient was last known well at about 1:30 on 03/18/2014. There's no previous history of stroke nor TIA. CT scan of the head showed a large right intraparenchymal hemorrhage with origin likely in basal ganglia, with intraventricular extension and a 1 cm right to left midline shift. Pupils are noted to be on reactive and right pupil was dilated. Blood pressure was markedly elevated and patient was started on Cardene drip. He was intubated and subsequently transferred to [facility] for further care. CT scan of his head also showed obstructive hydrocephalus. External ventricular drain was placed by Dr. Franky Machoabbell. Patient was not administered TPA secondary to acute ICH; beyond time window for treatment consideration, as well. He was admitted to the neuro ICU for further evaluation and treatment.   SUBJECTIVE (INTERVAL HISTORY) His family is not at the bedside.  Neurological Exam unchanged, ventric draining slowly now.  OBJECTIVE Temp:  [97.9 F (36.6 C)-100.6 F (38.1 C)] 98.9 F (37.2 C) (11/04 0745) Pulse Rate:  [74-122] 93 (11/04 0836) Cardiac Rhythm:  [-] Normal sinus rhythm (11/04 0700) Resp:  [15-53] 22 (11/04 0800) BP: (121-184)/(64-95) 146/79 mmHg (11/04 0836) SpO2:  [97 %-100 %] 100 % (11/04 0800) FiO2 (%):  [30 %-40 %] 40 % (11/04 0836) Weight:  [226 lb 3.1 oz (102.6 kg)] 226 lb 3.1 oz (102.6 kg) (11/04 0433)   Recent Labs Lab 03/20/14 1145 03/20/14 1950 03/20/14 2346 03/21/14 0344 03/21/14 0744  GLUCAP 125* 120* 117* 111* 103*    Recent Labs Lab 03/20/14 1030 03/21/14 0230  NA 142 145  K 4.3 4.6  CL 106 110  CO2 19 19  GLUCOSE 128* 119*  BUN 46* 62*  CREATININE 3.99* 4.27*  CALCIUM 8.9 8.8   No results  for input(s): AST, ALT, ALKPHOS, BILITOT, PROT, ALBUMIN in the last 168 hours.  Recent Labs Lab 03/30/2014 2153  WBC 13.2*  HGB 12.7*  HCT 38.2*  MCV 89.3  PLT 249    Recent Labs Lab 04/11/2014 1312 04/02/2014 2153 04/05/2014 2335  CKTOTAL 2522*  --   --   TROPONINI <0.30 <0.30 <0.30    Recent Labs  04/01/2014 2153  LABPROT 13.8  INR 1.05   No results for input(s): COLORURINE, LABSPEC, PHURINE, GLUCOSEU, HGBUR, BILIRUBINUR, KETONESUR, PROTEINUR, UROBILINOGEN, NITRITE, LEUKOCYTESUR in the last 72 hours.  Invalid input(s): APPERANCEUR     Component Value Date/Time   TRIG 88 04/05/2014 1252   HDL 46 11/02/2011 0835   CHOLHDL 3.2 11/02/2011 0835   LDLCALC 86 11/02/2011 0835   No results found for: HGBA1C    Component Value Date/Time   LABOPIA NONE DETECTED 04/09/2014 1143   COCAINSCRNUR NONE DETECTED 04/12/2014 1143   LABBENZ NONE DETECTED 03/20/2014 1143   AMPHETMU NONE DETECTED 04/09/2014 1143   THCU POSITIVE* 03/24/2014 1143   LABBARB NONE DETECTED 04/16/2014 1143    No results for input(s): ETH in the last 168 hours.  Ct Head Wo Contrast 03/20/2014   Large right basal ganglia and deep white matter intraparenchymal hemorrhage with intraventricular hemorrhage and associated mass effect with edema. Since the previous study, ventricles have decompressed in size following placement of shunt tube. Otherwise, no significant change since prior study.    Portable Chest Xray 03/20/2014   Endotracheal tube tip 4  cm above the carina.  Nasogastric tube courses below the diaphragm. Tip is not included on the present exam.  Central pulmonary vascular prominence.  Left base subsegmental atelectasis.  Heart size top-normal.      Dg Abd Portable 1v 04/16/2014    Tip of OG tube is in the distal stomach or proximal duodenum.      PHYSICAL EXAM Middle aged obese african [place] male who is intubated. . Afebrile. Head is nontraumatic. Neck is supple without bruit.   Cardiac exam no murmur  or gallop. Lungs are clear to auscultation. Distal pulses are well felt.  Neurological Exam : comatose and unresponsive. Eyes are closed. Right pupil is 4 mm fixed and left pupil 2 mm fixed and nonreactive. Dolls eye moments are absent. Corneal reflexes are present bilaterally. Face is symmetric. Tongue is midline.  Mild cough and gag noted. Spontaneous respiratory effort noted.Vision has mild respiratory effort above the ventilator. Motor response to sternal rub. Occasional intermittent extensor posturing upper extremities to sternal rub.. Minimum withdrawal in all extremities to painful stimuli. The plantars are both upgoing ASSESSMENT/PLAN Cristian Pollard. is a 48 y.o. male with history of hypertension, congestive heart failure and chronic kidney disease presenting after being found unresponsive at home after an unknown time. He did not receive IV t-PA due to hemorrhage.   Stroke:  Non-dominant large right basal ganglia hemorrhage with intraventricular extension, cytotoxic cerebral edema with transfalcine herniation and hydrocephalus  IVC placed 03/28/2014  SCDs for VTE prophylaxis  Diet NPO time specified   aspirin 81 mg orally every day prior to admission, now on no antithrombotics secondary to hemorrhage  Ongoing aggressive risk factor management  Resultant comatose state with poor neurological exam  Therapy recommendations:  On hold  Acute respiratory failure  Intubated  CCM following  Hypertension  Multiple home meds  Unstable, BP elevated  Placed on cardene   SBP goal <160  Hyperlipidemia  Home meds:  mevacor 20   Diabetes     Other Stroke Risk Factors Cigar smoker ETOH use THC positive this admission . Obesity, Body mass index is 35.42 kg/(m^2).   Other Active Problems  Hx CHF  CKD, Cr 4.28 on admission  Rhabdo, found down, CK 2522  Leukocytosis, no evidence of infection  Hospital day # 2  This patient is critically ill and at  significant risk of neurological worsening, death and care requires constant monitoring of vital signs, hemodynamics,respiratory and cardiac monitoring,review of multiple databases, neurological assessment, discussion with family, other specialists and medical decision making of high complexity.I have made any additions or clarifications directly to the above note.  I spent 30 minutes of neurocritical care time  in the care of  this patient. Delia Heady, MD  To contact Stroke Continuity provider, please refer to [email or web address]. After hours, contact General Neurology

## 2014-03-22 LAB — GLUCOSE, CAPILLARY
GLUCOSE-CAPILLARY: 104 mg/dL — AB (ref 70–99)
GLUCOSE-CAPILLARY: 118 mg/dL — AB (ref 70–99)
GLUCOSE-CAPILLARY: 123 mg/dL — AB (ref 70–99)
GLUCOSE-CAPILLARY: 99 mg/dL (ref 70–99)
Glucose-Capillary: 101 mg/dL — ABNORMAL HIGH (ref 70–99)
Glucose-Capillary: 102 mg/dL — ABNORMAL HIGH (ref 70–99)

## 2014-03-22 LAB — CBC WITH DIFFERENTIAL/PLATELET
Basophils Absolute: 0 10*3/uL (ref 0.0–0.1)
Basophils Relative: 0 % (ref 0–1)
EOS ABS: 0.1 10*3/uL (ref 0.0–0.7)
Eosinophils Relative: 1 % (ref 0–5)
HCT: 38.1 % — ABNORMAL LOW (ref 39.0–52.0)
Hemoglobin: 12.1 g/dL — ABNORMAL LOW (ref 13.0–17.0)
LYMPHS ABS: 1.1 10*3/uL (ref 0.7–4.0)
LYMPHS PCT: 8 % — AB (ref 12–46)
MCH: 29.5 pg (ref 26.0–34.0)
MCHC: 31.8 g/dL (ref 30.0–36.0)
MCV: 92.9 fL (ref 78.0–100.0)
MONOS PCT: 9 % (ref 3–12)
Monocytes Absolute: 1.2 10*3/uL — ABNORMAL HIGH (ref 0.1–1.0)
NEUTROS PCT: 82 % — AB (ref 43–77)
Neutro Abs: 11.4 10*3/uL — ABNORMAL HIGH (ref 1.7–7.7)
PLATELETS: 256 10*3/uL (ref 150–400)
RBC: 4.1 MIL/uL — ABNORMAL LOW (ref 4.22–5.81)
RDW: 14.9 % (ref 11.5–15.5)
WBC: 13.8 10*3/uL — ABNORMAL HIGH (ref 4.0–10.5)

## 2014-03-22 LAB — BASIC METABOLIC PANEL
ANION GAP: 13 (ref 5–15)
BUN: 65 mg/dL — ABNORMAL HIGH (ref 6–23)
CHLORIDE: 119 meq/L — AB (ref 96–112)
CO2: 21 mEq/L (ref 19–32)
CREATININE: 3.73 mg/dL — AB (ref 0.50–1.35)
Calcium: 8.9 mg/dL (ref 8.4–10.5)
GFR calc non Af Amer: 18 mL/min — ABNORMAL LOW (ref >90)
GFR, EST AFRICAN AMERICAN: 20 mL/min — AB (ref >90)
Glucose, Bld: 107 mg/dL — ABNORMAL HIGH (ref 70–99)
POTASSIUM: 4.6 meq/L (ref 3.7–5.3)
SODIUM: 153 meq/L — AB (ref 137–147)

## 2014-03-22 LAB — TRIGLYCERIDES: TRIGLYCERIDES: 169 mg/dL — AB (ref <150)

## 2014-03-22 MED ORDER — LORAZEPAM BOLUS VIA INFUSION
2.0000 mg | INTRAVENOUS | Status: DC | PRN
  Filled 2014-03-22: qty 5

## 2014-03-22 MED ORDER — MORPHINE SULFATE 2 MG/ML IJ SOLN
2.0000 mg | INTRAMUSCULAR | Status: DC | PRN
  Administered 2014-03-22 – 2014-03-24 (×12): 4 mg via INTRAVENOUS
  Filled 2014-03-22 (×12): qty 2

## 2014-03-22 NOTE — Progress Notes (Signed)
Patient ID: Cristian Sers., male   DOB: 1965/11/05, 48 y.o.   MRN: [id number] BP 163/94 mmHg  Pulse 109  Temp(Src) 99.3 F (37.4 C) (Axillary)  Resp 26  Ht 5\' 7"  (1.702 m)  Wt 102.1 kg (225 lb 1.4 oz)  BMI 35.25 kg/m2  SpO2 93% Comatose. Extensor posturing Pupils not reactive +cough Family has decided to withdraw support. I do agree with their decision. There has been no improvement in his exam since admission.  ventric is working.

## 2014-03-22 NOTE — Progress Notes (Signed)
STROKE TEAM PROGRESS NOTE   HISTORY Cristian SersLeonard Alvis Clendenning Jr. is an 48 y.o. male with a history of hypertension, congestive heart failure and chronic kidney disease who was brought to [facility] emergency room after being found unresponsive at home. Patient was last known well at about 1:30 on 03/18/2014. There's no previous history of stroke nor TIA. CT scan of the head showed a large right intraparenchymal hemorrhage with origin likely in basal ganglia, with intraventricular extension and a 1 cm right to left midline shift. Pupils are noted to be on reactive and right pupil was dilated. Blood pressure was markedly elevated and patient was started on Cardene drip. He was intubated and subsequently [facility]. CT scan of his head also showed obstructive hydrocephalus. External ventricular drain was placed by Dr. Franky Machoabbell. Patient was not administered TPA secondary to acute ICH; beyond time window for treatment consideration, as well. He was admitted to the neuro ICU for further evaluation and treatment.   SUBJECTIVE (INTERVAL HISTORY) His family is not at the bedside.  Neurological Exam  Worsened now with posturing and increased BP, ventric draining slowly now.  OBJECTIVE Temp:  [98.5 F (36.9 C)-99.8 F (37.7 C)] 98.5 F (36.9 C) (11/05 0734) Pulse Rate:  [78-122] 87 (11/05 0800) Cardiac Rhythm:  [-] Normal sinus rhythm (11/05 0800) Resp:  [21-39] 32 (11/05 0828) BP: (138-209)/(80-127) 164/100 mmHg (11/05 0800) SpO2:  [91 %-100 %] 98 % (11/05 0800) FiO2 (%):  [30 %-40 %] 30 % (11/05 0828) Weight:  [225 lb 1.4 oz (102.1 kg)] 225 lb 1.4 oz (102.1 kg) (11/05 0500)   Recent Labs Lab 03/21/14 1543 03/21/14 1914 03/22/14 0022 03/22/14 0358 03/22/14 0732  GLUCAP 122* 113* 118* 99 101*    Recent Labs Lab 03/20/14 1030 03/21/14 0230 03/22/14 0232  NA 142 145 153*  K 4.3 4.6 4.6  CL 106 110 119*  CO2 19 19 21   GLUCOSE 128* 119* 107*   BUN 46* 62* 65*  CREATININE 3.99* 4.27* 3.73*  CALCIUM 8.9 8.8 8.9   No results for input(s): AST, ALT, ALKPHOS, BILITOT, PROT, ALBUMIN in the last 168 hours.  Recent Labs Lab 07/20/13 2153 03/22/14 0345  WBC 13.2* 13.8*  NEUTROABS  --  11.4*  HGB 12.7* 12.1*  HCT 38.2* 38.1*  MCV 89.3 92.9  PLT 249 256    Recent Labs Lab 07/20/13 1312 07/20/13 2153 07/20/13 2335  CKTOTAL 2522*  --   --   TROPONINI <0.30 <0.30 <0.30    Recent Labs  07/20/13 2153  LABPROT 13.8  INR 1.05   No results for input(s): COLORURINE, LABSPEC, PHURINE, GLUCOSEU, HGBUR, BILIRUBINUR, KETONESUR, PROTEINUR, UROBILINOGEN, NITRITE, LEUKOCYTESUR in the last 72 hours.  Invalid input(s): APPERANCEUR     Component Value Date/Time   TRIG 88 06/12/13 1252   HDL 46 11/02/2011 0835   CHOLHDL 3.2 11/02/2011 0835   LDLCALC 86 11/02/2011 0835   No results found for: HGBA1C    Component Value Date/Time   LABOPIA NONE DETECTED 06/12/13 1143   COCAINSCRNUR NONE DETECTED 06/12/13 1143   LABBENZ NONE DETECTED 06/12/13 1143   AMPHETMU NONE DETECTED 06/12/13 1143   THCU POSITIVE* 06/12/13 1143   LABBARB NONE DETECTED 06/12/13 1143    No results for input(s): ETH in the last 168 hours.  Ct Head Wo Contrast 03/20/2014   Large right basal ganglia and deep white matter intraparenchymal hemorrhage with intraventricular hemorrhage and associated mass effect with edema. Since the previous study,  ventricles have decompressed in size following placement of shunt tube. Otherwise, no significant change since prior study.    Portable Chest Xray 03/20/2014   Endotracheal tube tip 4 cm above the carina.  Nasogastric tube courses below the diaphragm. Tip is not included on the present exam.  Central pulmonary vascular prominence.  Left base subsegmental atelectasis.  Heart size top-normal.      Dg Abd Portable 1v 03-Apr-2014    Tip of OG tube is in the distal stomach or proximal duodenum.      PHYSICAL  EXAM Middle aged obese african [place] male who is intubated. . Afebrile. Head is nontraumatic. Neck is supple without bruit.   Cardiac exam no murmur or gallop. Lungs are clear to auscultation. Distal pulses are well felt.  Neurological Exam : comatose and unresponsive. Eyes are closed. Right pupil is 4 mm fixed and left pupil 2 mm fixed and nonreactive. Dolls eye moments are absent. Corneal reflexes are present bilaterally. Face is symmetric. Tongue is midline.  Mild cough and gag noted. Spontaneous respiratory effort noted.He has mild respiratory effort above the ventilator. Extensor posture response in LE greater than UE to sternal rub.   Minimum withdrawal in all extremities to painful stimuli. The plantars are both upgoing ASSESSMENT/PLAN Cristian Pollard. is a 48 y.o. male with history of hypertension, congestive heart failure and chronic kidney disease presenting after being found unresponsive at home after an unknown time. He did not receive IV t-PA due to hemorrhage.   Stroke:  Non-dominant large right basal ganglia hemorrhage with intraventricular extension, cytotoxic cerebral edema with transfalcine herniation and hydrocephalus- worsening neurological exam with posturing unlikely to survive . Family yet to make decision on withdrawal of care  IVC placed 04-03-2014  SCDs for VTE prophylaxis  Diet NPO time specified   aspirin 81 mg orally every day prior to admission, now on no antithrombotics secondary to hemorrhage  Ongoing aggressive risk factor management  Resultant comatose state with poor neurological exam  Therapy recommendations:  On hold  Acute respiratory failure  Intubated  CCM following  Hypertension  Multiple home meds  Unstable, BP elevated  Placed on cardene   SBP goal <160  Hyperlipidemia  Home meds:  mevacor 20   Diabetes     Other Stroke Risk Factors Cigar smoker ETOH use THC positive this admission . Obesity, Body mass index  is 35.25 kg/(m^2).   Other Active Problems  Hx CHF  CKD, Cr 4.28 on admission  Rhabdo, found down, CK 2522  Leukocytosis, no evidence of infection  Hospital day # 3  This patient is critically ill and at significant risk of neurological worsening, death and care requires constant monitoring of vital signs, hemodynamics,respiratory and cardiac monitoring,review of multiple databases, neurological assessment, discussion with family, other specialists and medical decision making of high complexity.I have made any additions or clarifications directly to the above note.  I spent 35 minutes of neurocritical care time  in the care of  this patient. Delia Heady, MD  To contact Stroke Continuity provider, please refer to [email or web address]. After hours, contact General Neurology

## 2014-03-22 NOTE — Progress Notes (Signed)
UR completed.  Bonna Steury, RN BSN [facility] CCM Trauma/Neuro ICU Case Manager [phone]  

## 2014-03-22 NOTE — Progress Notes (Signed)
Chaplain spoke with pt nurse. Made on-call chaplain aware of upcoming withdrawal. Page if chaplain support needed.    03/22/14 1600  Clinical Encounter Type  Visited With Health care provider  Visit Type Follow-up  Huy Majid, Loa Socks 03/22/2014 4:53 PM

## 2014-03-22 NOTE — Progress Notes (Signed)
RT Note: WIthdrawl of life sustaining support protocol was ordered earlier this afternoon. RT is still awaiting for the RN to notify us that the family is ready to begin the process of discontinuing life support. RT will continue to stay on standby and remain available once they are ready. Rt will continue to monitor.

## 2014-03-22 NOTE — Progress Notes (Signed)
PULMONARY / CRITICAL CARE MEDICINE   Name: Cristian Pollard. MRN: [id number] DOB: 02-07-1966    ADMISSION DATE:  03/20/2014 CONSULTATION DATE:  03/22/2014  REFERRING MD :  Christella Noa  CHIEF COMPLAINT:  Basal Ganglia Hemorrhage  INITIAL PRESENTATION: 48 y.o. M brought to [facility] ED on 11/2 after being found unresponsive by his neice.  In ED, he required intubation and was found to have large right basal ganglia bleed.  He was transferred to [facility] for further evaluation and management.  PCCM consulted for vent management.     STUDIES:  11/2 CT Head >>> extensive intraparenchymal hemorrhage arising from right basal ganglia and occupying most of the right basal ganglia except for the caudate nucleus.  There is intraventricular extension with evidence of obstructive hydrocephalus as well as MLS of the 3rd ventricle towards the left. 11/3 CT head > large R basal ganglia and deep white matter intraparenchymal hemorrhage with intraventricular hemorrhage and associated mass effect with edema, 12 mm midline shift, ventriculostomy tube in place  SIGNIFICANT EVENTS: 11/2 - found down by family, taken to [facility], found to have right basal ganglia bleed, transferred to [facility]; UDS positive for marijuana, but not cocaine; ventriculostomy drain placed by Dr. Christella Noa 11/3 - made DNR 11/5 family has decided to withdraw care   SUBJECTIVE:   No acute events  VITAL SIGNS: Temp:  [98.5 F (36.9 C)-99.8 F (37.7 C)] 98.5 F (36.9 C) (11/05 0734) Pulse Rate:  [78-122] 118 (11/05 1000) Resp:  [21-39] 33 (11/05 1000) BP: (138-214)/(80-127) 214/122 mmHg (11/05 1000) SpO2:  [91 %-100 %] 98 % (11/05 1000) FiO2 (%):  [30 %-40 %] 30 % (11/05 1000) Weight:  [102.1 kg (225 lb 1.4 oz)] 102.1 kg (225 lb 1.4 oz) (11/05 0500) HEMODYNAMICS:   VENTILATOR SETTINGS: Vent Mode:  [-] PRVC FiO2 (%):  [30 %-40 %] 30 % Set Rate:  [22 bmp] 22 bmp Vt Set:  [530 mL] 530 mL PEEP:  [5 cmH20] 5 cmH20 Pressure Support:  [5 cmH20]  5 cmH20 Plateau Pressure:  [11 PZW25-85 cmH20] 14 cmH20 INTAKE / OUTPUT: Intake/Output      11/04 0701 - 11/05 0700 11/05 0701 - 11/06 0700   I.V. (mL/kg) 2608.5 (25.5) 303.7 (3)   NG/GT 670 30   Total Intake(mL/kg) 3278.5 (32.1) 333.7 (3.3)   Urine (mL/kg/hr) 4915 (2) 350 (0.9)   Drains 284 (0.1) 27 (0.1)   Total Output 5199 377   Net -1920.5 -43.3          PHYSICAL EXAMINATION: General: on vent, no distress HEENT: Ventriculostomy drain in place, ETT in place PULM: CTA B CV: RRR, no mgr AB: BS+, soft, nontender Ext: no edema Neuro: extensor response to pain on R hand, posturing, no response anywhere else  LABS:  CBC  Recent Labs Lab 03/29/2014 2153 03/22/14 0345  WBC 13.2* 13.8*  HGB 12.7* 12.1*  HCT 38.2* 38.1*  PLT 249 256   Coag's  Recent Labs Lab 03/22/2014 2153  APTT 30  INR 1.05   BMET  Recent Labs Lab 03/20/14 1030 03/21/14 0230 03/22/14 0232  NA 142 145 153*  K 4.3 4.6 4.6  CL 106 110 119*  CO2 $Re'19 19 21  'PNp$ BUN 46* 62* 65*  CREATININE 3.99* 4.27* 3.73*  GLUCOSE 128* 119* 107*   Electrolytes  Recent Labs Lab 03/20/14 1030 03/21/14 0230 03/22/14 0232  CALCIUM 8.9 8.8 8.9   Sepsis Markers  Recent Labs Lab 04/12/2014 1312  LATICACIDVEN 1.8   ABG  Recent Labs  Lab 03/31/2014 1319  PHART 7.461*  PCO2ART 40.0  PO2ART 65.0*   Liver Enzymes No results for input(s): AST, ALT, ALKPHOS, BILITOT, ALBUMIN in the last 168 hours. Cardiac Enzymes  Recent Labs Lab 03/18/2014 1312 03/22/2014 2153 04/11/2014 2335  TROPONINI <0.30 <0.30 <0.30   Glucose  Recent Labs Lab 03/21/14 1138 03/21/14 1543 03/21/14 1914 03/22/14 0022 03/22/14 0358 03/22/14 0732  GLUCAP 112* 122* 113* 118* 99 101*    Imaging No results found.    ASSESSMENT / PLAN:  NEUROLOGIC Intraventricular drain 11/2 >>> A:   Large intraparenchymal hemorrhage arising from right basal ganglia with obstructive hydrocephalus and MLS to the left> presumably due to  hypertension Acute encephalopathy due to above 11/5 > worsening edema and now posturing P: Change to prn morphine   PULMONARY OETT 11/2 >>> 11/5 A: Acute respiratory failure in setting of intraparenchymal hemorrhage Weaning on PSV but mental status limits extubation P:   Plan palliative, one way extubation today  CARDIOVASCULAR A:  Hx of HTN on multiple meds Hypertensive cardiomyopathy Systolic CHF (4259 EF 56-38%), currently compensated P:  Hold all meds except morphine  RENAL A:   CKD -  Mildly elevated CK P:   Hold all further labs  GASTROINTESTINAL A:   GI prophylaxis Nutrition P:   Hold tube feeding  HEMATOLOGIC A:   VTE Prophylaxis P:  OK to d/c SCD  INFECTIOUS A:   Fever 11/3 P:   No further work up  ENDOCRINE A:   No known issues P:   Monitor glucose on BMP.   Family updated: I have spoken with the family at length on 11/5.  I met with two sisters, his brother, and his aunt and uncle.  I explained to them the severity of his injury and the fact that he will not survive this illness with any sort of meaningful quality of life.  They understand the situation and have requested withdrawal of care stating that the know he would not want to live like this.   TODAY'S SUMMARY: 48 y.o. M transferred from [facility] with large intraparenchymal hemorrhage.  IVC drain placed by NSGY 11/2.  PCCM consulted for vent management.   Now planning for withdrawal of care today  I have personally obtained a history, examined the patient, evaluated laboratory and imaging results, formulated the assessment and plan and placed orders. CRITICAL CARE: The patient is critically ill with multiple organ systems failure and requires high complexity decision making for assessment and support, frequent evaluation and titration of therapies, application of advanced monitoring technologies and extensive interpretation of multiple databases. Critical Care Time devoted to patient care  services described in this note is 50 minutes.   Roselie Awkward, MD [facility] PCCM Pager: [phone] Cell: [phone] If no response, call [phone]

## 2014-03-22 NOTE — Plan of Care (Signed)
Problem: Acute Treatment Outcomes Goal: BP within ordered parameters Outcome: Completed/Met Date Met:  03/22/14  Problem: Progression Outcomes Goal: If vent dependent, tolerates weaning Outcome: Progressing Goal: Tolerating diet/TF at goal rate Outcome: Progressing

## 2014-03-22 NOTE — Progress Notes (Signed)
Nutrition Brief Note  Chart reviewed. Pt now transitioning to comfort care.  No further nutrition interventions warranted at this time.  Please re-consult as needed.   Shaketta Rill RD, LDN, CNSC [phone] Pager [phone] After Hours Pager    

## 2014-03-22 NOTE — Progress Notes (Signed)
2049 called the patient's brother, Antonio to follow up regarding extubation for comfort care that was discussed in the family meeting with Dr. Kendrick Fries this a.m. The brother states that he is getting the family together and wants to extubate on Saturday with family present. Called Elink to relay information. Will keep patient on comfort care per Dr. Craige Cotta. No new orders given at this time.

## 2014-03-23 MED ORDER — NICARDIPINE HCL IN NACL 20-0.86 MG/200ML-% IV SOLN: 3.0000 mg/h | INTRAVENOUS | Status: DC

## 2014-03-23 MED ORDER — NICARDIPINE HCL IN NACL 40-0.83 MG/200ML-% IV SOLN
3.0000 mg/h | INTRAVENOUS | Status: DC
  Administered 2014-03-23: 3 mg/h via INTRAVENOUS
  Administered 2014-03-23 – 2014-03-24 (×3): 5.5 mg/h via INTRAVENOUS
  Filled 2014-03-23 (×4): qty 200

## 2014-03-23 NOTE — Progress Notes (Signed)
Per Dr. Franky Macho, IVC is to be clamped after pt is extubated

## 2014-03-23 NOTE — Progress Notes (Signed)
Updated pt's family at bedside about current status.  Discussed plans for therapy.  They are in agreement to not escalate care.  They confirmed DNR status.  Will continue current therapies for now.  Plan will be to proceed with terminal extubation on 11/07 once family arrives.  Reviewed this process with pt's family.  Also explained that dying process is unpredictable, and that he might be transferred to inpatient palliative care room after extubation.  D/w Dr. Franky Macho.  Coralyn Helling, MD [facility] Pulmonary/Critical Care 03/23/2014, 11:16 AM Pager:  [phone] After 3pm call: [phone]

## 2014-03-23 NOTE — Progress Notes (Signed)
Note comfort care. Stroke Team will sign off. Please call if any questions.  Annie Main, MSN, APRN, ANVP-BC, AGPCNP-BC [facility] Pager: [phone] 03/23/2014 8:41 AM

## 2014-03-23 NOTE — Plan of Care (Signed)
Problem: Progression Outcomes Goal: Hemodynamically stable Outcome: Not Progressing

## 2014-03-23 NOTE — Plan of Care (Signed)
Problem: Acute Treatment Outcomes Goal: Coagulation normalized Outcome: Completed/Met Date Met:  03/23/14 Goal: Airway maintained/protected Outcome: Completed/Met Date Met:  03/23/14 Goal: 02 Sats > 94% Outcome: Completed/Met Date Met:  03/23/14

## 2014-03-23 NOTE — Clinical Social Work Note (Signed)
Clinical Social Worker received referral for comfort care support for family who is withdrawing support from patient on Saturday 11/7.  CSW has spoken with RN regarding patient family.  Patient family appropriately coping with a very sad situation.  Chaplain and CSW will continue to follow patient family through withdraw process and be available as needed.  RN to notify CSW if further needs arise following patient death.  CSW remains available for outside support to family and staff as needed.  Macario Golds, [place] [phone]

## 2014-03-24 DIAGNOSIS — Z66 Do not resuscitate: Secondary | ICD-10-CM

## 2014-03-24 DIAGNOSIS — Z515 Encounter for palliative care: Secondary | ICD-10-CM

## 2014-03-24 DIAGNOSIS — I618 Other nontraumatic intracerebral hemorrhage: Secondary | ICD-10-CM

## 2014-03-24 DIAGNOSIS — Z01818 Encounter for other preprocedural examination: Secondary | ICD-10-CM

## 2014-03-24 MED ORDER — LORAZEPAM 2 MG/ML IJ SOLN
1.0000 mg/h | INTRAVENOUS | Status: DC
  Administered 2014-03-24: 2 mg/h via INTRAVENOUS
  Administered 2014-03-24: 8 mg/h via INTRAVENOUS
  Filled 2014-03-24 (×2): qty 25

## 2014-03-24 MED ORDER — SODIUM CHLORIDE 0.9 % IV SOLN: INTRAVENOUS | Status: DC

## 2014-03-24 MED ORDER — MORPHINE SULFATE 10 MG/ML IJ SOLN
1.0000 mg/h | INTRAMUSCULAR | Status: DC
  Administered 2014-03-24: 14 mg/h via INTRAVENOUS
  Administered 2014-03-24: 2 mg/h via INTRAVENOUS
  Filled 2014-03-24 (×2): qty 10

## 2014-03-24 MED ORDER — FENTANYL CITRATE 0.05 MG/ML IJ SOLN: 25.0000 ug | INTRAMUSCULAR | Status: DC | PRN

## 2014-03-24 NOTE — Progress Notes (Signed)
Chaplain present at time of withdrawal. Family has strong support system and did not need services at this time. Chaplain introduced herself to more family members. Page chaplain as needed.    03/24/14 1500  Clinical Encounter Type  Visited With Health care provider;Family  Visit Type Follow-up;Spiritual support;Patient actively dying  Referral From Nurse  Charmian Muff, Chaplain 03/24/2014 3:27 PM

## 2014-03-24 NOTE — Significant Event (Signed)
Called to bedside by RN. Met with family and friends of Mr. Noy. They(the family) are ready to proceed with terminal extubation with goal of care being comfort. They have spoken with Neurosurgery and Critical Care over last 2 days and are aware of his hopeless situation. Terminal extubation orders written and MSO4 drip ordered to provide comfort.  Richardson Landry Hanifa Antonetti ACNP [facility] PCCM Pager [phone] till 3 pm If no answer page [phone] 03/24/2014, 1:50 PM

## 2014-03-24 NOTE — Procedures (Signed)
Extubation Procedure Note  Patient Details:   Name: Cristian Pollard. DOB: 1965/12/03 MRN: [id number]   Airway Documentation:     Evaluation  O2 sats: transiently fell during during procedure Complications: No apparent complications Patient did not tolerate procedure well. Bilateral Breath Sounds: Clear Suctioning: Airway No. Pt was terminally extubated.  Placed on 15/5 originally and worked our way  To 5/5.  Pt still breathing but was comfortable.  Extubated to room air.    Aseel Truxillo V 03/24/2014, 2:55 PM

## 2014-03-24 NOTE — Progress Notes (Signed)
PULMONARY / CRITICAL CARE MEDICINE   Name: Cristian Pollard. MRN: [id number] DOB: 1965-07-31    ADMISSION DATE:  03/29/2014 CONSULTATION DATE:  03/24/2014  REFERRING MD :  Christella Noa  CHIEF COMPLAINT:  Basal Ganglia Hemorrhage  INITIAL PRESENTATION: 48 y.o. M brought to [facility] ED on 11/2 after being found unresponsive by his neice.  In ED, he required intubation and was found to have large right basal ganglia bleed.  He was transferred to [facility] for further evaluation and management.  PCCM consulted for vent management.     STUDIES:  11/2 CT Head >>> extensive intraparenchymal hemorrhage arising from right basal ganglia and occupying most of the right basal ganglia except for the caudate nucleus.  There is intraventricular extension with evidence of obstructive hydrocephalus as well as MLS of the 3rd ventricle towards the left. 11/3 CT head > large R basal ganglia and deep white matter intraparenchymal hemorrhage with intraventricular hemorrhage and associated mass effect with edema, 12 mm midline shift, ventriculostomy tube in place  SIGNIFICANT EVENTS: 11/2 - found down by family, taken to [facility], found to have right basal ganglia bleed, transferred to [facility]; UDS positive for marijuana, but not cocaine; ventriculostomy drain placed by Dr. Christella Noa 11/3 - made DNR 11/5 family has decided to withdraw care 11/7 planned for wd when family arrives  SUBJECTIVE:   No acute events  VITAL SIGNS: Temp:  [98.1 F (36.7 C)-103 F (39.4 C)] 101 F (38.3 C) (11/07 0740) Pulse Rate:  [96-141] 115 (11/07 0900) Resp:  [23-34] 25 (11/07 0900) BP: (144-188)/(85-102) 154/90 mmHg (11/07 0900) SpO2:  [92 %-97 %] 96 % (11/07 0900) FiO2 (%):  [30 %-40 %] 40 % (11/07 0808) HEMODYNAMICS:   VENTILATOR SETTINGS: Vent Mode:  [-] PRVC FiO2 (%):  [30 %-40 %] 40 % Set Rate:  [22 bmp] 22 bmp Vt Set:  [530 mL] 530 mL PEEP:  [5 cmH20] 5 cmH20 Plateau Pressure:  [18 cmH20-19 cmH20] 18 cmH20 INTAKE /  OUTPUT: Intake/Output      11/06 0701 - 11/07 0700 11/07 0701 - 11/08 0700   I.V. (mL/kg) 503.8 (4.9) 82.5 (0.8)   NG/GT     Total Intake(mL/kg) 503.8 (4.9) 82.5 (0.8)   Urine (mL/kg/hr) 2500 (1) 350 (1.1)   Drains 112 (0) 35 (0.1)   Total Output 2612 385   Net -2108.3 -302.5          PHYSICAL EXAMINATION: General: on vent, no distress HEENT: Ventriculostomy drain in place, ETT in place PULM: CTA B CV: RRR, no mgr AB: BS+, soft, nontender Ext: no edema Neuro: extensor response to pain on R hand, posturing, no response anywhere else  LABS:  CBC  Recent Labs Lab 03/29/2014 2153 03/22/14 0345  WBC 13.2* 13.8*  HGB 12.7* 12.1*  HCT 38.2* 38.1*  PLT 249 256   Coag's  Recent Labs Lab 04/03/2014 2153  APTT 30  INR 1.05   BMET  Recent Labs Lab 03/20/14 1030 03/21/14 0230 03/22/14 0232  NA 142 145 153*  K 4.3 4.6 4.6  CL 106 110 119*  CO2 _0 BUN 46* 62* 65*  CREATININE 3.99* 4.27* 3.73*  GLUCOSE 128* 119* 107*   Electrolytes  Recent Labs Lab 03/20/14 1030 03/21/14 0230 03/22/14 0232  CALCIUM 8.9 8.8 8.9   Sepsis Markers  Recent Labs Lab 03/31/2014 1312  LATICACIDVEN 1.8   ABG  Recent Labs Lab 03/26/2014 1319  PHART 7.461*  PCO2ART 40.0  PO2ART 65.0*   Liver Enzymes No  results for input(s): AST, ALT, ALKPHOS, BILITOT, ALBUMIN in the last 168 hours. Cardiac Enzymes  Recent Labs Lab 03/23/2014 1312 03/27/2014 2153 03/18/2014 2335  TROPONINI <0.30 <0.30 <0.30   Glucose  Recent Labs Lab 03/21/14 1914 03/22/14 0022 03/22/14 0358 03/22/14 0732 03/22/14 1135 03/22/14 1532  GLUCAP 113* 118* 99 101* 102* 104*    Imaging No results found.    ASSESSMENT / PLAN:  NEUROLOGIC Intraventricular drain 11/2 >>>to be clamped A:   Large intraparenchymal hemorrhage arising from right basal ganglia with obstructive hydrocephalus and MLS to the left> presumably due to hypertension Acute encephalopathy due to above 11/5 > worsening edema  and now posturing P: Change to prn morphine   PULMONARY OETT 11/2 >>> 11/5 A: Acute respiratory failure in setting of intraparenchymal hemorrhage Weaning on PSV but mental status limits extubation P:   Plan palliative, one way extubation today  CARDIOVASCULAR A:  Hx of HTN on multiple meds Hypertensive cardiomyopathy Systolic CHF (3668 EF 15-94%), currently compensated P:  Hold all meds except morphine  RENAL A:   CKD -  Mildly elevated CK P:   Hold all further labs  GASTROINTESTINAL A:   GI prophylaxis Nutrition P:   Hold tube feeding  HEMATOLOGIC A:   VTE Prophylaxis P:  OK to d/c SCD  INFECTIOUS A:   Fever 11/3 P:   No further work up  ENDOCRINE A:   No known issues P:   Monitor glucose on BMP.   Family updated: I have spoken with the family at length on 11/5.  I met with two sisters, his brother, and his aunt and uncle.  I explained to them the severity of his injury and the fact that he will not survive this illness with any sort of meaningful quality of life.  They understand the situation and have requested withdrawal of care stating that the know he would not want to live like this.   TODAY'S SUMMARY: 48 y.o. M transferred from [facility] with large intraparenchymal hemorrhage.  IVC drain placed by NSGY 11/2.  PCCM consulted for vent management.   Now planning for withdrawal of care today 11/7   [facility] Minor ACNP [facility] PCCM Pager [phone] till 3 pm If no answer page [phone] 03/24/2014, 10:14 AM

## 2014-03-24 NOTE — Progress Notes (Signed)
Overall stable. Awaiting decision with regard to withdrawal of care. No new neurosurgical recommendations.

## 2014-03-24 NOTE — Progress Notes (Signed)
Chaplain present with family and minister at pt bedside. Family minister currently with family. Minister offered prayer and chaplain joined in. Chaplain made family aware of her presence should they need it. Chaplain will stay nearby.    03/24/14 1300  Clinical Encounter Type  Visited With Patient and family together;Health care provider  Visit Type Trauma;Spiritual support  Referral From Nurse  Spiritual Encounters  Spiritual Needs Grief support;Emotional;Prayer  Stress Factors  Family Stress Factors Loss  Gabrielle Mester, Mayer Masker, Chaplain 03/24/2014 1:48 PM

## 2014-04-04 NOTE — Discharge Summary (Signed)
  Physician Discharge Summary  Patient ID: Cristian Pollard. MRN: [id number] DOB/AGE: [birth year] 48 y.o.  Admit date: 04/06/2014 Discharge date: 04/04/2014  Admission Diagnoses:Hemorrhagic stroke, acute obstructive hydrocephalus, Intraventricular hemorrhage, hypertension  Discharge Diagnoses:  Active Problems:   Stroke, hemorrhagic   ICH (intracerebral hemorrhage)   Hypertensive emergency   Intraparenchymal hemorrhage of brain   DNAR (do not attempt resuscitation)   Terminal care   Encounter for intubation   Discharged Condition: deceased  Hospital Course: [name] was admitted to the hospital on an emergent basis for a large ICH, with intraventricular extension. He had non reactive pupils, was comatose, was intubated and in extremis. I placed a ventricular catheter emergently, and admitted him to the neuro icu. [name] did not improve whatsoever during his hospitalization. After many discussions with myself, the stroke team, and the critical care team his family opted to withdraw support entailing extubation, and no treatment for his blood pressure. He expired in a short time period.  Treatments: surgery: ventricular catheter placement  Discharge Exam: Blood pressure 150/85, pulse 113, temperature 101 F (38.3 C), temperature source Oral, resp. rate 25, height 5\' 7"  (1.702 m), weight 102.1 kg (225 lb 1.4 oz), SpO2 92 %. deceased  Disposition: 20-Expired * No surgery found *    Medication List    ASK your doctor about these medications        aspirin 81 MG tablet  Take 81 mg by mouth daily.     cloNIDine 0.2 MG tablet  Commonly known as:  CATAPRES  Take 0.2 mg by mouth 2 (two) times daily.     hydrALAZINE 100 MG tablet  Commonly known as:  APRESOLINE  TAKE ONE TABLET BY MOUTH 4 TIMES DAILY     isosorbide mononitrate 30 MG 24 hr tablet  Commonly known as:  IMDUR  Take 1 tablet (30 mg total) by mouth daily.     labetalol 300 MG tablet  Commonly  known as:  NORMODYNE  TAKE ONE TABLET BY MOUTH TWICE DAILY     losartan 100 MG tablet  Commonly known as:  COZAAR  Take 100 mg by mouth daily.     lovastatin 20 MG tablet  Commonly known as:  MEVACOR  Take 20 mg by mouth at bedtime.     sildenafil 100 MG tablet  Commonly known as:  VIAGRA  Take 1 tablet (100 mg total) by mouth daily as needed for erectile dysfunction.         Signed: Lulubelle Simcoe L 04/04/2014, 7:08 PM

## 2014-04-17 NOTE — Progress Notes (Signed)
30 ml Ativan and 70 ml Morphine wasted in sink, witness by RN Barbarann Ehlers.

## 2014-04-17 NOTE — Progress Notes (Signed)
Pt expired at 0208 (TOD). Absence of pulse,RR, BP, fixed and dilated pupils co-checked with Juleen Starr, RN. CDS, ME and MD made aware. Checklist completed.

## 2014-04-17 DEATH — deceased

## 2014-04-30 ENCOUNTER — Ambulatory Visit: Payer: Managed Care, Other (non HMO) | Admitting: Cardiovascular Disease

## 2015-11-18 IMAGING — CT CT HEAD W/O CM
2 series · 15 of 30 positions shown, 19 images · non-contrast
Comparison: 03/19/2014 from [REDACTED].

CLINICAL DATA: Patient found on floor nonresponsive.

EXAM:
CT HEAD WITHOUT CONTRAST
TECHNIQUE: Contiguous axial images were obtained from the base of the skull
through the vertex without intravenous contrast.

[Series 201: head w/o, idose (1) · axial · non-contrast · 0.49mm/px · z∈[+156,+286]mm · 13 of 32 slices shown, 17 images]
[im 3/32  brain]
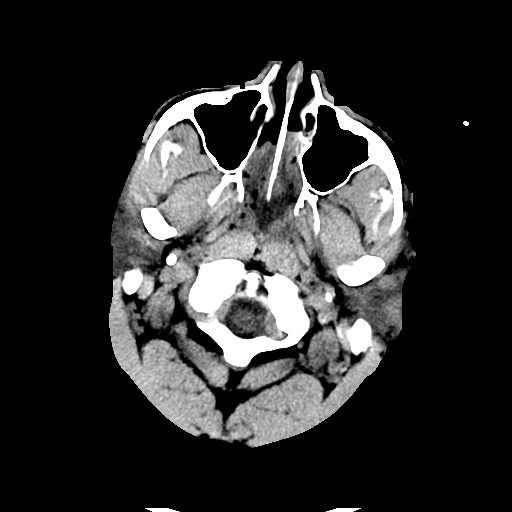
[im 3/32  bone]
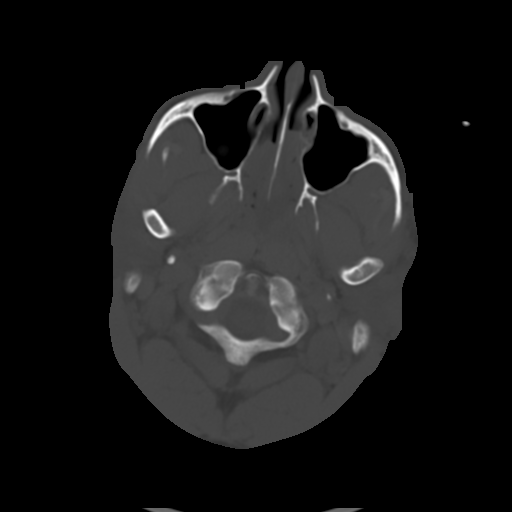
[im 5/32  brain]
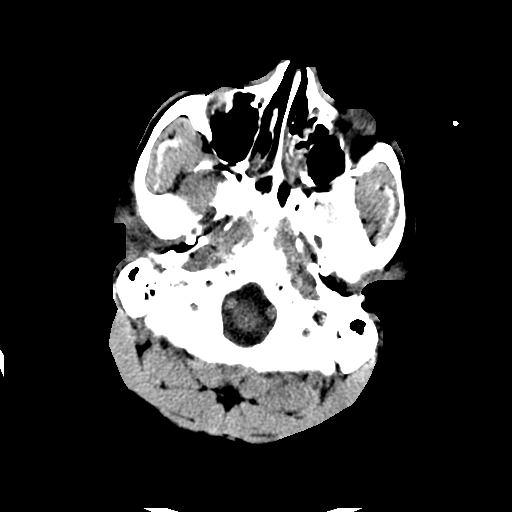
[im 7/32  brain]
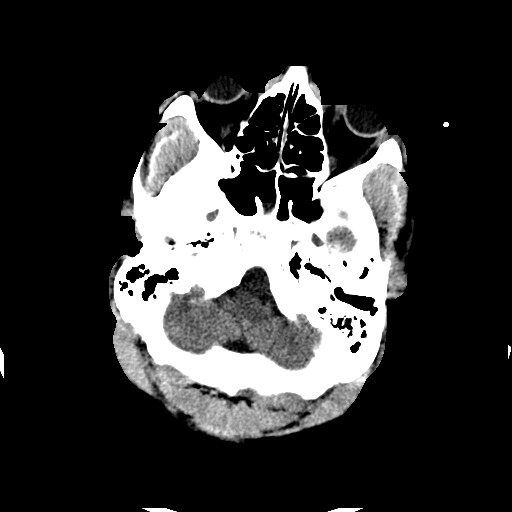
[im 9/32  brain]
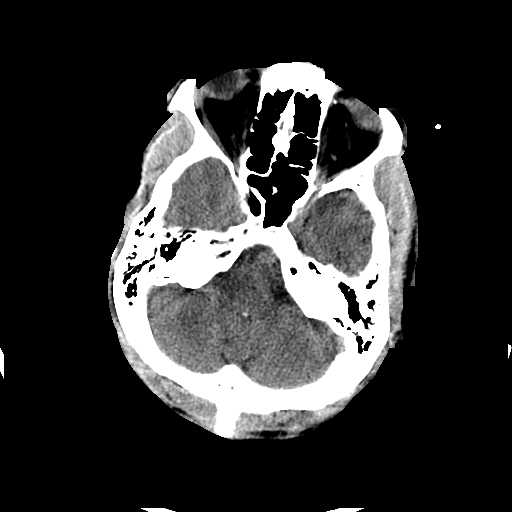
[im 12/32  brain]
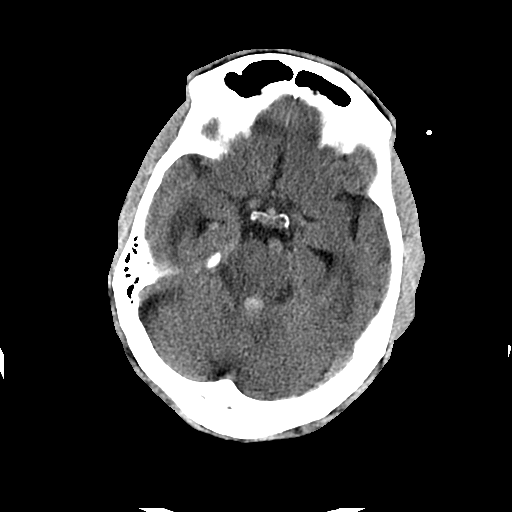
[im 12/32  bone]
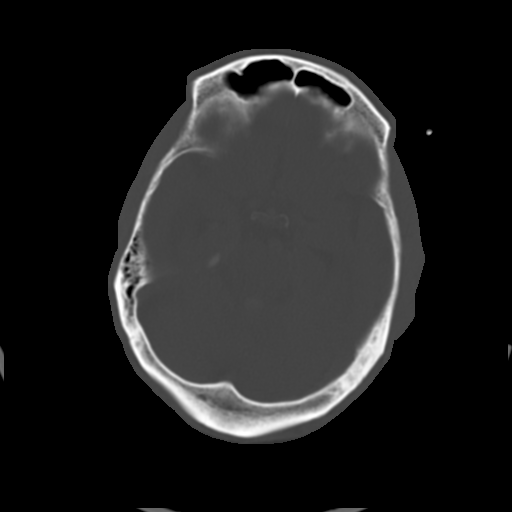
[im 14/32  brain]
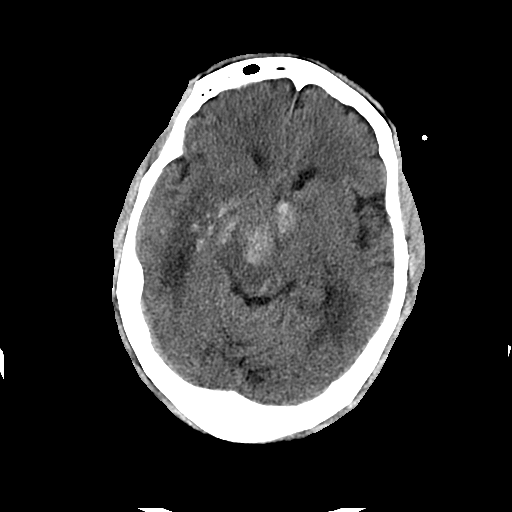
[im 16/32  brain]
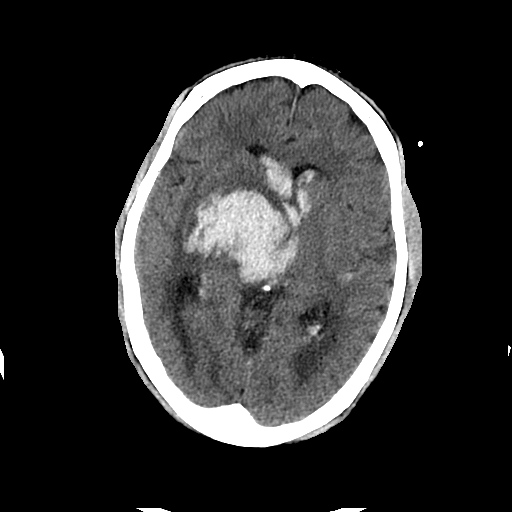
[im 18/32  brain]
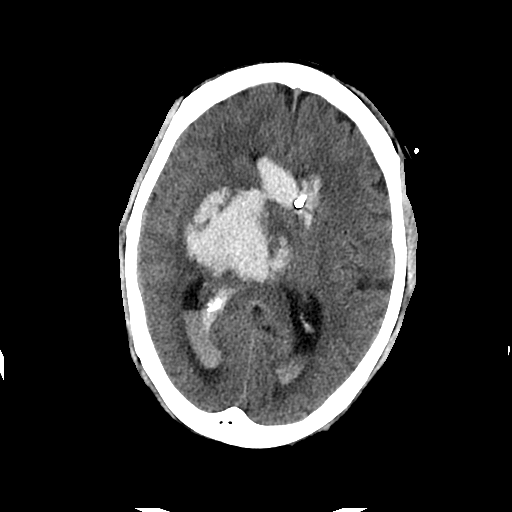
[im 20/32  brain]
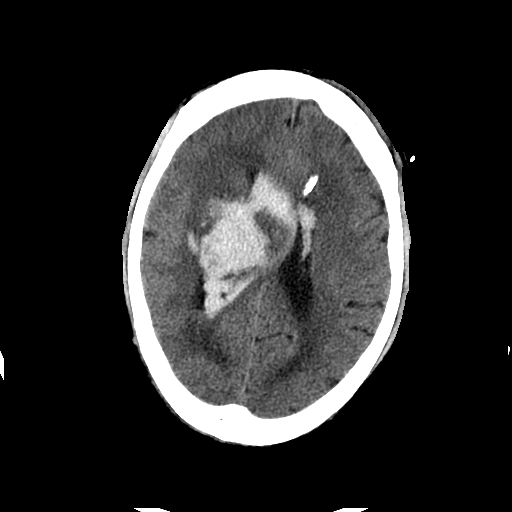
[im 20/32  bone]
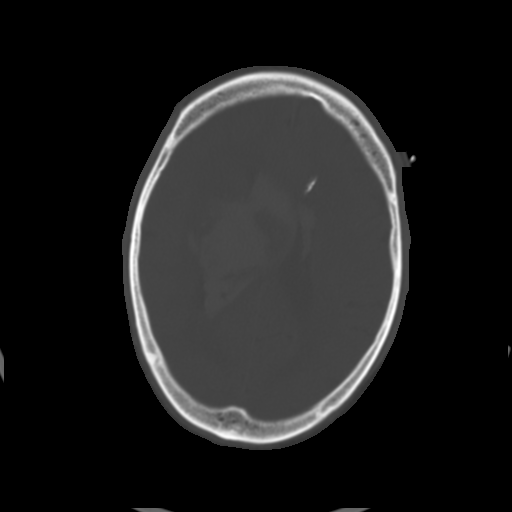
[im 23/32  brain]
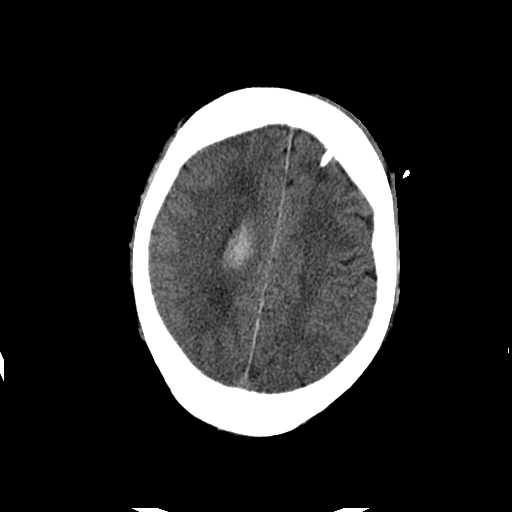
[im 25/32  brain]
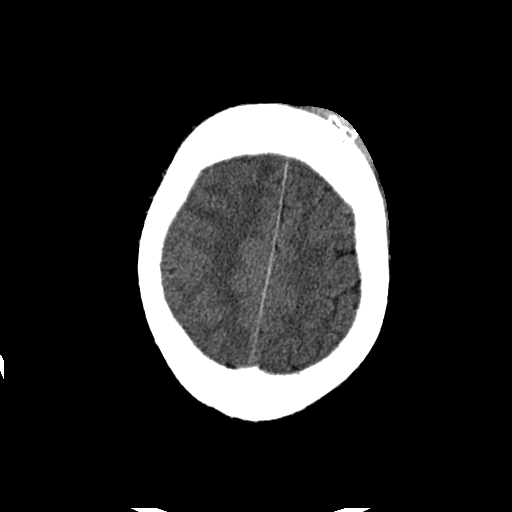
[im 27/32  brain]
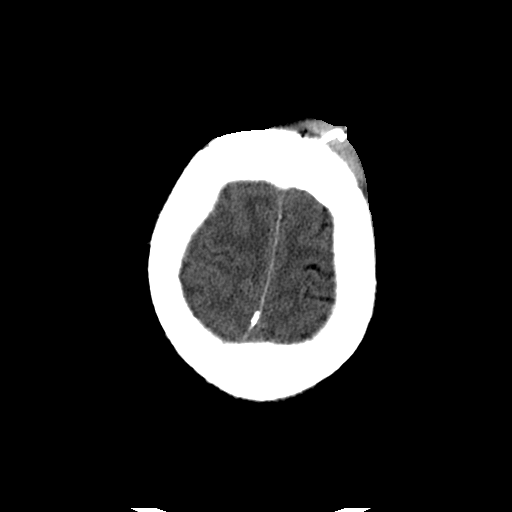
[im 29/32  brain]
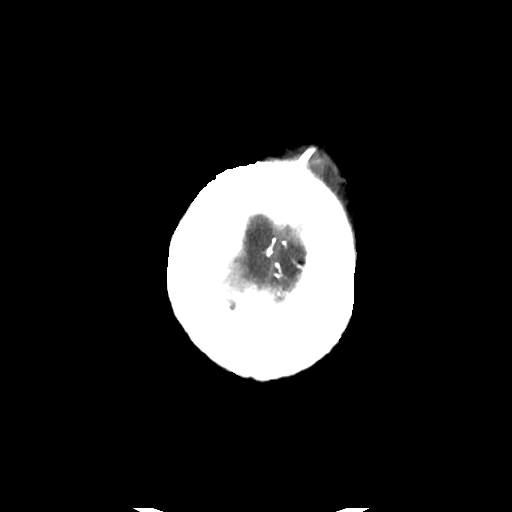
[im 29/32  bone]
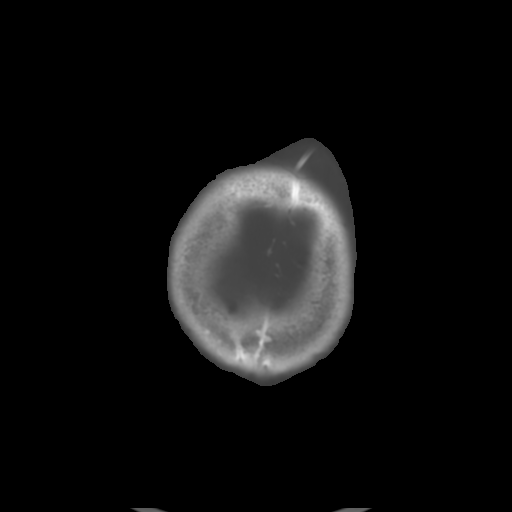

[Series 202: head w/o bone, idose (1) · axial · non-contrast · 0.49mm/px · z∈[+156,+176]mm · 2 of 32 slices shown]
[im 3/32  bone]
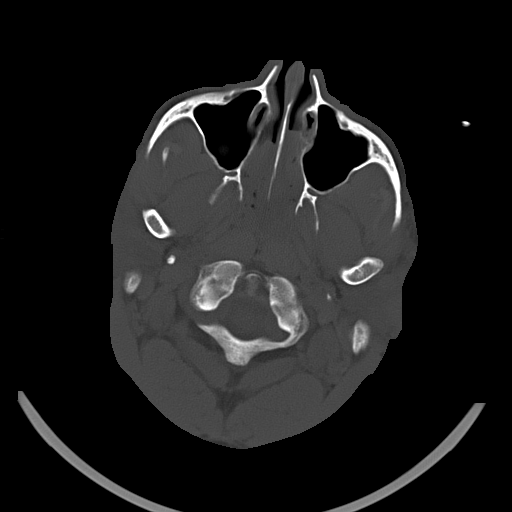
[im 7/32  bone]
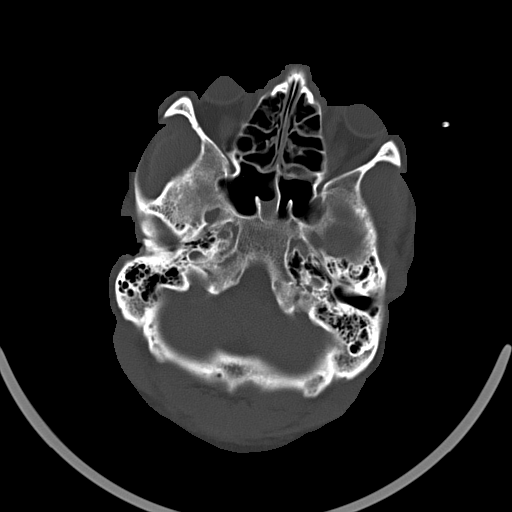

[15 of 30 positions shown; findings below may reference images not displayed]

FINDINGS: There is a large acute intraparenchymal hemorrhage in the right
basal ganglia and deep white matter. Hematoma measures about 4.7 x
4.8 cm. Large amount of blood demonstrated in the lateral, third,
and fourth ventricles bilaterally. No subarachnoid or subdural
blood. Small amount of edema in the right cerebral hemisphere. There
is mass effect with effacement of right cerebral sulci and right to
left midline shift of approximately 12 mm. Left trans frontal
intraventricular shunt tube arising through a left frontal
craniostomy. No depressed skull fractures. Mucosal thickening in the
paranasal sinuses with retention cyst in the right maxillary antrum
and right sphenoid sinus. Mastoid air cells are not opacified.
IMPRESSION: Large right basal ganglia and deep white matter intraparenchymal
hemorrhage with intraventricular hemorrhage and associated mass
effect with edema. Since the previous study, ventricles have
decompressed in size following placement of shunt tube. Otherwise,
no significant change since prior study.

These results were called by telephone at the time of interpretation
on 03/20/2014 at [DATE] to Lien[place], the patient's nurse on [id number]
Neuro ICU, who verbally acknowledged these results.
